# Patient Record
Sex: Female | Born: 1990 | Race: Black or African American | Hispanic: No | Marital: Single | Smoking: Never smoker
Health system: Southern US, Community
[De-identification: ages and names within clinical notes are randomized; demographics above are authoritative.]

## PROBLEM LIST (undated history)

## (undated) DIAGNOSIS — F32A Depression, unspecified: Secondary | ICD-10-CM

## (undated) DIAGNOSIS — A599 Trichomoniasis, unspecified: Principal | ICD-10-CM

## (undated) DIAGNOSIS — B9689 Other specified bacterial agents as the cause of diseases classified elsewhere: Secondary | ICD-10-CM

## (undated) DIAGNOSIS — D573 Sickle-cell trait: Secondary | ICD-10-CM

## (undated) DIAGNOSIS — N760 Acute vaginitis: Secondary | ICD-10-CM

## (undated) DIAGNOSIS — Z889 Allergy status to unspecified drugs, medicaments and biological substances status: Secondary | ICD-10-CM

## (undated) DIAGNOSIS — A4902 Methicillin resistant Staphylococcus aureus infection, unspecified site: Secondary | ICD-10-CM

## (undated) HISTORY — DX: Other specified bacterial agents as the cause of diseases classified elsewhere: N76.0

## (undated) HISTORY — DX: Depression, unspecified: F32.A

## (undated) HISTORY — DX: Allergy status to unspecified drugs, medicaments and biological substances: Z88.9

## (undated) HISTORY — DX: Methicillin resistant Staphylococcus aureus infection, unspecified site: A49.02

## (undated) HISTORY — DX: Trichomoniasis, unspecified: A59.9

## (undated) HISTORY — DX: Other specified bacterial agents as the cause of diseases classified elsewhere: B96.89

## (undated) HISTORY — PX: NO PAST SURGERIES: SHX2092

---
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
## 2009-10-04 ENCOUNTER — Emergency Department (HOSPITAL_COMMUNITY)
Admission: EM | Admit: 2009-10-04 | Discharge: 2009-10-04 | Payer: Self-pay | Source: Home / Self Care | Admitting: Emergency Medicine

## 2010-05-24 ENCOUNTER — Ambulatory Visit (HOSPITAL_COMMUNITY)
Admission: RE | Admit: 2010-05-24 | Discharge: 2010-05-24 | Payer: Self-pay | Source: Home / Self Care | Attending: Obstetrics & Gynecology | Admitting: Obstetrics & Gynecology

## 2010-05-27 ENCOUNTER — Emergency Department (HOSPITAL_COMMUNITY)
Admission: EM | Admit: 2010-05-27 | Discharge: 2010-05-27 | Payer: Self-pay | Source: Home / Self Care | Admitting: Emergency Medicine

## 2010-08-13 LAB — BASIC METABOLIC PANEL
CO2: 24 mEq/L (ref 19–32)
Calcium: 8.8 mg/dL (ref 8.4–10.5)
Chloride: 107 mEq/L (ref 96–112)
GFR calc Af Amer: >60 mL/min (ref >60)
Glucose, Bld: 94 mg/dL (ref 70–99)
Potassium: 3.5 mEq/L (ref 3.5–5.1)
Sodium: 137 mEq/L (ref 135–145)

## 2010-08-13 LAB — CBC
HCT: 31 % — ABNORMAL LOW (ref 36.0–46.0)
Hemoglobin: 10.7 g/dL — ABNORMAL LOW (ref 12.0–15.0)
MCH: 29.2 pg (ref 26.0–34.0)
MCHC: 34.5 g/dL (ref 30.0–36.0)
RBC: 3.66 MIL/uL — ABNORMAL LOW (ref 3.87–5.11)

## 2010-08-13 LAB — DIFFERENTIAL
Basophils Relative: 0 % (ref 0–1)
Eosinophils Absolute: 0.1 10*3/uL (ref 0.0–0.7)
Lymphs Abs: 1.4 10*3/uL (ref 0.7–4.0)
Monocytes Absolute: 0.6 10*3/uL (ref 0.1–1.0)
Monocytes Relative: 8 % (ref 3–12)
Neutrophils Relative %: 72 % (ref 43–77)

## 2010-08-13 LAB — HEPATIC FUNCTION PANEL
AST: 20 U/L (ref 0–37)
Albumin: 2.9 g/dL — ABNORMAL LOW (ref 3.5–5.2)
Alkaline Phosphatase: 42 U/L (ref 39–117)
Bilirubin, Direct: <0.1 mg/dL (ref 0.0–0.3)
Total Bilirubin: 0.5 mg/dL (ref 0.3–1.2)

## 2010-08-13 LAB — LIPASE, BLOOD: Lipase: 36 U/L (ref 11–59)

## 2010-08-17 ENCOUNTER — Other Ambulatory Visit: Payer: Self-pay | Admitting: Obstetrics & Gynecology

## 2010-08-17 ENCOUNTER — Inpatient Hospital Stay (HOSPITAL_COMMUNITY)
Admission: AD | Admit: 2010-08-17 | Discharge: 2010-08-19 | DRG: 775 | Disposition: A | Discharge status: Home / Self Care | Payer: 59 | Source: Ambulatory Visit | Attending: Family Medicine | Admitting: Family Medicine

## 2010-08-17 DIAGNOSIS — Z22330 Carrier of Group B streptococcus: Secondary | ICD-10-CM

## 2010-08-17 DIAGNOSIS — O9989 Other specified diseases and conditions complicating pregnancy, childbirth and the puerperium: Secondary | ICD-10-CM

## 2010-08-17 DIAGNOSIS — O99892 Other specified diseases and conditions complicating childbirth: Secondary | ICD-10-CM | POA: Diagnosis present

## 2010-08-17 LAB — CBC
HCT: 36.6 % (ref 36.0–46.0)
MCH: 26.5 pg (ref 26.0–34.0)
MCHC: 31.1 g/dL (ref 30.0–36.0)
MCV: 84.1 fL (ref 78.0–100.0)
MCV: 84.5 fL (ref 78.0–100.0)
Platelets: 207 10*3/uL (ref 150–400)
Platelets: 192 10*3/uL (ref 150–400)
RDW: 14.5 % (ref 11.5–15.5)
RDW: 14.5 % (ref 11.5–15.5)

## 2010-08-18 LAB — CBC
MCHC: 31.7 g/dL (ref 30.0–36.0)
Platelets: 185 10*3/uL (ref 150–400)
RDW: 14.7 % (ref 11.5–15.5)
WBC: 13.5 10*3/uL — ABNORMAL HIGH (ref 4.0–10.5)

## 2010-09-20 ENCOUNTER — Encounter (HOSPITAL_COMMUNITY): Payer: Self-pay | Admitting: Radiology

## 2010-09-20 ENCOUNTER — Emergency Department (HOSPITAL_COMMUNITY)
Admission: EM | Admit: 2010-09-20 | Discharge: 2010-09-20 | Disposition: A | Discharge status: Home / Self Care | Payer: 59 | Attending: Emergency Medicine | Admitting: Emergency Medicine

## 2010-09-20 ENCOUNTER — Emergency Department (HOSPITAL_COMMUNITY): Payer: 59

## 2010-09-20 DIAGNOSIS — D279 Benign neoplasm of unspecified ovary: Secondary | ICD-10-CM | POA: Insufficient documentation

## 2010-09-20 DIAGNOSIS — R1031 Right lower quadrant pain: Secondary | ICD-10-CM | POA: Insufficient documentation

## 2010-09-20 LAB — BASIC METABOLIC PANEL
Chloride: 103 mEq/L (ref 96–112)
Creatinine, Ser: 0.65 mg/dL (ref 0.4–1.2)
GFR calc Af Amer: >60 mL/min (ref >60)
Sodium: 137 mEq/L (ref 135–145)

## 2010-09-20 LAB — URINALYSIS, ROUTINE W REFLEX MICROSCOPIC
Glucose, UA: NEGATIVE mg/dL
Protein, ur: NEGATIVE mg/dL
Urobilinogen, UA: 0.2 mg/dL (ref 0.0–1.0)

## 2010-09-20 LAB — DIFFERENTIAL
Basophils Absolute: 0 10*3/uL (ref 0.0–0.1)
Basophils Relative: 1 % (ref 0–1)
Monocytes Absolute: 0.3 10*3/uL (ref 0.1–1.0)
Neutro Abs: 3.6 10*3/uL (ref 1.7–7.7)
Neutrophils Relative %: 64 % (ref 43–77)

## 2010-09-20 LAB — CBC
Hemoglobin: 13.6 g/dL (ref 12.0–15.0)
MCHC: 31.9 g/dL (ref 30.0–36.0)
RBC: 5.01 MIL/uL (ref 3.87–5.11)
WBC: 5.6 10*3/uL (ref 4.0–10.5)

## 2010-09-20 LAB — URINE MICROSCOPIC-ADD ON

## 2010-09-20 LAB — POCT PREGNANCY, URINE: Preg Test, Ur: NEGATIVE

## 2010-09-20 MED ORDER — IOHEXOL 300 MG/ML  SOLN
100.0000 mL | Freq: Once | INTRAMUSCULAR | Status: AC | PRN
  Administered 2010-09-20: 100 mL via INTRAVENOUS

## 2011-02-23 ENCOUNTER — Emergency Department (HOSPITAL_COMMUNITY)
Admission: EM | Admit: 2011-02-23 | Discharge: 2011-02-23 | Disposition: A | Discharge status: Home / Self Care | Payer: 59 | Attending: Emergency Medicine | Admitting: Emergency Medicine

## 2011-02-23 ENCOUNTER — Encounter (HOSPITAL_COMMUNITY): Payer: Self-pay

## 2011-02-23 DIAGNOSIS — J029 Acute pharyngitis, unspecified: Secondary | ICD-10-CM

## 2011-02-23 LAB — RAPID STREP SCREEN (MED CTR MEBANE ONLY): Streptococcus, Group A Screen (Direct): NEGATIVE

## 2011-02-23 NOTE — ED Provider Notes (Signed)
History     CSN: [id number] Arrival date & time: 02/23/2011 10:42 AM  Chief Complaint  Patient presents with  . Sore Throat    HPI  (Consider location/radiation/quality/duration/timing/severity/associated sxs/prior treatment)  Patient is a 20 y.o. female presenting with pharyngitis. The history is provided by the patient. No language interpreter was used.  Sore Throat This is a new problem. The current episode started yesterday. The problem occurs constantly. The problem has been unchanged. Associated symptoms include a sore throat. Pertinent negatives include no fatigue, fever, headaches, rash or swollen glands.    History reviewed. No pertinent past medical history.  History reviewed. No pertinent past surgical history.  History reviewed. No pertinent family history.  History  Substance Use Topics  . Smoking status: Never Smoker   . Smokeless tobacco: Not on file  . Alcohol Use: No    OB History    Grav Para Term Preterm Abortions TAB SAB Ect Mult Living                  Review of Systems  Review of Systems  Constitutional: Negative for fever and fatigue.  HENT: Positive for sore throat.   Skin: Negative for rash.  Neurological: Negative for headaches.  All other systems reviewed and are negative.    Allergies  Review of patient's allergies indicates no known allergies.  Home Medications  No current outpatient prescriptions on file.  Physical Exam    BP 97/64  Pulse 94  Temp(Src) 99.1 F (37.3 C) (Oral)  Resp 20  Ht 5\' 2"  (1.575 m)  Wt 120 lb (54.432 kg)  BMI 21.95 kg/m2  SpO2 100%  LMP 02/16/2011  Physical Exam  Nursing note and vitals reviewed. Constitutional: She is oriented to person, place, and time. She appears well-developed and well-nourished. No distress.  HENT:  Head: Normocephalic and atraumatic.  Mouth/Throat: No oropharyngeal exudate.       pharyngeal erythema.  No tonsillar swelling.  Eyes: EOM are normal.  Neck: Normal range  of motion.  Cardiovascular: Normal rate, regular rhythm and normal heart sounds.   Pulmonary/Chest: Effort normal and breath sounds normal.  Abdominal: Soft. She exhibits no distension. There is no tenderness.  Musculoskeletal: Normal range of motion.  Lymphadenopathy:    She has no cervical adenopathy.  Neurological: She is alert and oriented to person, place, and time. No cranial nerve deficit or sensory deficit.  Skin: Skin is warm and dry. She is not diaphoretic.  Psychiatric: She has a normal mood and affect. Judgment normal.    ED Course  Procedures (including critical care time)   Labs Reviewed  RAPID STREP SCREEN   No results found.   No diagnosis found.   MDM         Worthy Rancher, PA 02/23/11 1158

## 2011-02-23 NOTE — ED Notes (Signed)
Pt presents with sore throat since yesterday. Pt denies taking medication for symptoms. Pt denies all other symptoms.

## 2011-02-23 NOTE — ED Provider Notes (Signed)
Medical screening examination/treatment/procedure(s) were performed by non-physician practitioner and as supervising physician I was immediately available for consultation/collaboration.   Oluwadara Gorman M [place]endale Wherry, DO 02/23/11 1947 

## 2011-02-24 ENCOUNTER — Emergency Department (HOSPITAL_COMMUNITY)
Admission: EM | Admit: 2011-02-24 | Discharge: 2011-02-24 | Disposition: A | Discharge status: Home / Self Care | Payer: 59 | Attending: Emergency Medicine | Admitting: Emergency Medicine

## 2011-02-24 ENCOUNTER — Encounter (HOSPITAL_COMMUNITY): Payer: Self-pay | Admitting: *Deleted

## 2011-02-24 DIAGNOSIS — J029 Acute pharyngitis, unspecified: Secondary | ICD-10-CM | POA: Insufficient documentation

## 2011-02-24 MED ORDER — NAPROXEN 500 MG PO TABS: 500.0000 mg | ORAL_TABLET | Freq: Two times a day (BID) | ORAL | Status: DC

## 2011-02-24 NOTE — ED Notes (Addendum)
Pt states was here yesterday with same complaint, returned here today with worsening symptoms.  Pt reports sore throat started Friday and continually gotten worse. Has taken tylenol last at 1800 and taken 3 doses of thera-flu yesterday. Denies fever/chills and N/V.  Redness noted in throat, however no "white patches" noted as pt reports to seeing earlier today.

## 2011-02-24 NOTE — ED Notes (Signed)
Pt c/o sore throat; pt states she has white patches on her throat

## 2011-02-24 NOTE — ED Notes (Signed)
MD at bedside. 

## 2011-02-24 NOTE — ED Provider Notes (Signed)
History     CSN: [id number] Arrival date & time: 02/24/2011 10:45 AM  Chief Complaint  Patient presents with  . Sore Throat    HPI  (Consider location/radiation/quality/duration/timing/severity/associated sxs/prior treatment)  The history is provided by the patient.  ONSET OF SORETHROAT ON Friday SEEN YESTERDAY IN ED WITH NEGATIVE STREP. NO HX OF STREP IN THE PAST. PATIENT STATES THROAT IS NOT BETTER AND NOW HAS WHITE STUFF ON TONSILS. NO CONGESTION NO COUGH NO FEVER.   History reviewed. No pertinent past medical history.  History reviewed. No pertinent past surgical history.  History reviewed. No pertinent family history.  History  Substance Use Topics  . Smoking status: Never Smoker   . Smokeless tobacco: Not on file  . Alcohol Use: No    OB History    Grav Para Term Preterm Abortions TAB SAB Ect Mult Living                  Review of Systems  Review of Systems  Constitutional: Negative for fever.  HENT: Negative for ear pain, congestion, neck pain and neck stiffness.   Eyes: Negative for redness.  Respiratory: Negative for cough and shortness of breath.   Cardiovascular: Negative for chest pain.  Gastrointestinal: Negative for nausea, vomiting, abdominal pain and diarrhea.  Genitourinary: Negative for dysuria.  Musculoskeletal: Negative for back pain.  Skin: Negative for rash.  Neurological: Negative for headaches.    Allergies  Review of patient's allergies indicates no known allergies.  Home Medications   Current Outpatient Rx  Name Route Sig Dispense Refill  . ETONOGESTREL 68 MG [place] IMPL Subcutaneous Inject 1 each into the skin continuous.      . THROAT SPRAY MT Mouth/Throat Use as directed 1-2 sprays in the mouth or throat as needed. For sore throat     . TYLENOL COLD PO Oral Take 30 mLs by mouth as needed. For sore throat       Physical Exam    BP 110/66  Pulse 84  Temp(Src) 99 F (37.2 C) (Oral)  Resp 18  Ht 5\' 2"  (1.575 m)  Wt 120 lb  (54.432 kg)  BMI 21.95 kg/m2  SpO2 100%  LMP 02/16/2011  Physical Exam  Nursing note and vitals reviewed. Constitutional: She is oriented to person, place, and time. She appears well-developed and well-nourished.  HENT:  Head: Normocephalic.  Mouth/Throat: Oropharynx is clear and moist.  Eyes: Conjunctivae and EOM are normal. Pupils are equal, round, and reactive to light.  Neck: Normal range of motion. Neck supple.  Cardiovascular: Normal rate, regular rhythm and normal heart sounds.   No murmur heard. Pulmonary/Chest: Effort normal and breath sounds normal. She has no wheezes. She has no rales. She exhibits no tenderness.  Abdominal: Soft. Bowel sounds are normal. There is no tenderness.  Musculoskeletal: Normal range of motion. She exhibits no edema and no tenderness.  Neurological: She is alert and oriented to person, place, and time. No cranial nerve deficit.  Skin: Skin is warm and dry. No rash noted.    ED Course  Procedures (including critical care time)  Results for orders placed during the hospital encounter of 02/23/11  RAPID STREP SCREEN      Component Value Range   Streptococcus, Group A Screen (Direct) NEGATIVE  NEGATIVE         MDM PHARANGITIS.  STEP NEGATIVE COULD BE EARLY MONONUCLEOSIS  WILL RX WITH NAPROSYN CAN GET MONO TESTING IF NOT IMPROVING IN ONE WEEK.  Shelda Jakes, MD 02/24/11 [phone]

## 2011-02-24 NOTE — ED Notes (Signed)
MD at bedside. Dr Deretha Emory  At bedside, examined pt and spoke with pt in regards to plan of care. Pt verbalized understanding.

## 2011-11-22 IMAGING — CT CT ABD-PELV W/ CM
2 of 3 series · 16 of 46 positions shown, 18 images · IV contrast (Omnipaque 300)
Comparison: None

CLINICAL DATA: Lower quadrant pain, vaginal delivery 1 month ago

CT ABDOMEN AND PELVIS WITH CONTRAST
TECHNIQUE: Multidetector CT imaging of the abdomen and pelvis was
performed following the standard protocol during bolus
administration of intravenous contrast. Sagittal and coronal MPR
images reconstructed from axial data set.
Contrast: Dilute oral contrast and 100 ml Vmnipaque-K88 IV

[Series 2: abd_pel_with 5.0 b40f · axial · 0.59mm/px · z∈[-440,-70]mm · 13 of 86 slices shown, 15 images]
[im 6/86  soft-tissue]
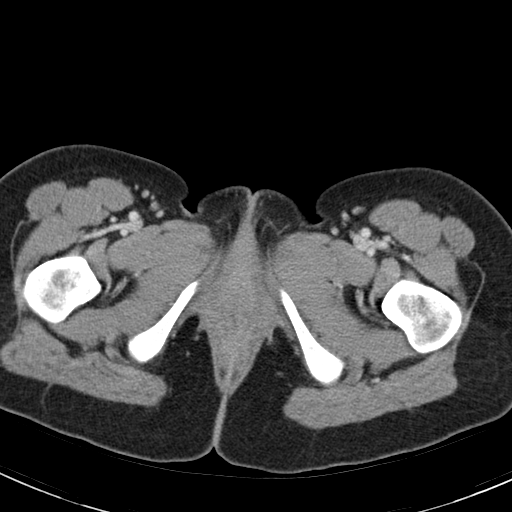
[im 6/86  bone]
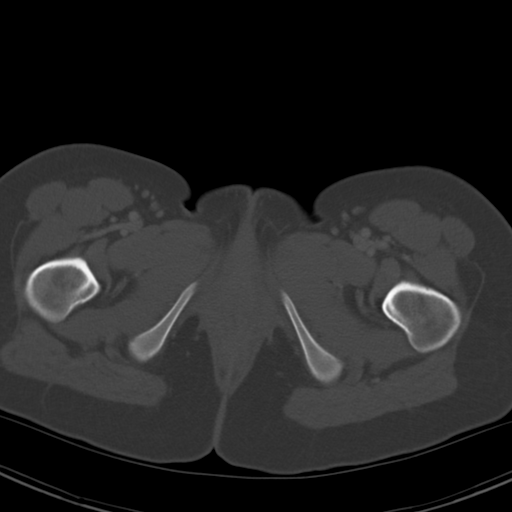
[im 11/86  soft-tissue]
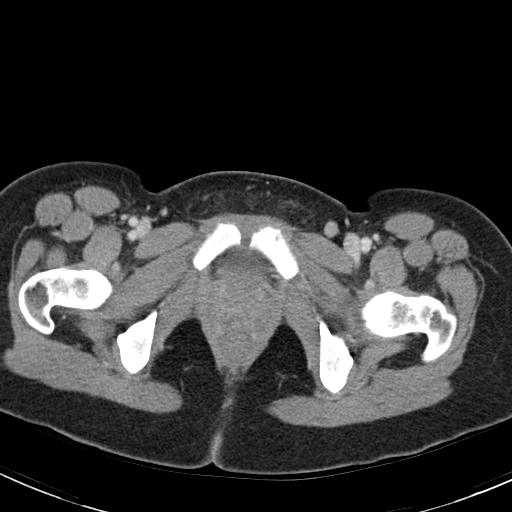
[im 17/86  soft-tissue]
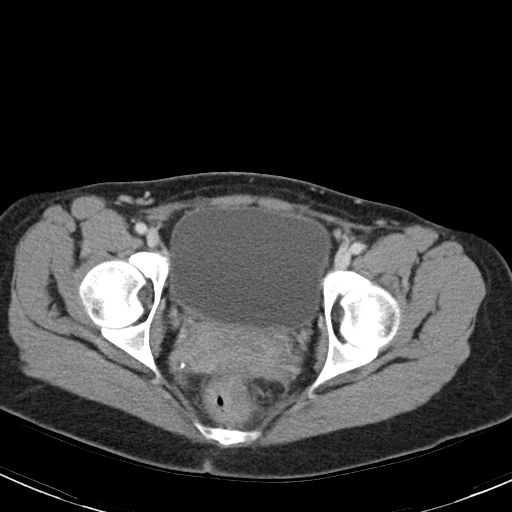
[im 25/86  soft-tissue]
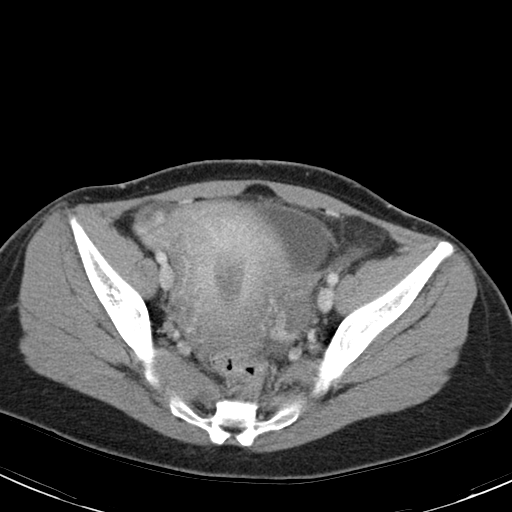
[im 31/86  soft-tissue]
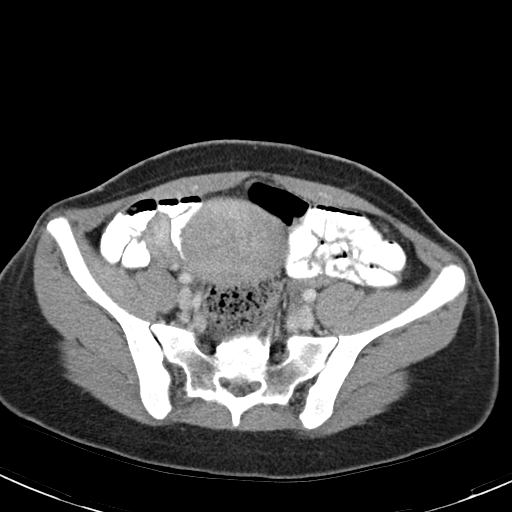
[im 36/86  soft-tissue]
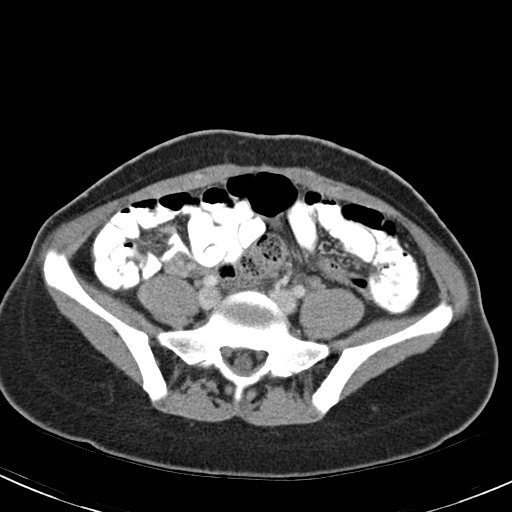
[im 44/86  soft-tissue]
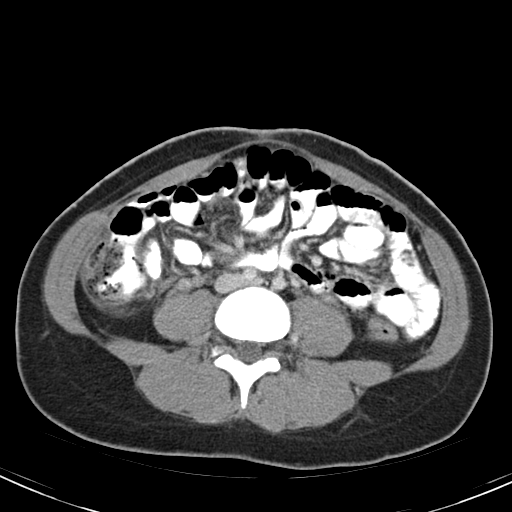
[im 50/86  soft-tissue]
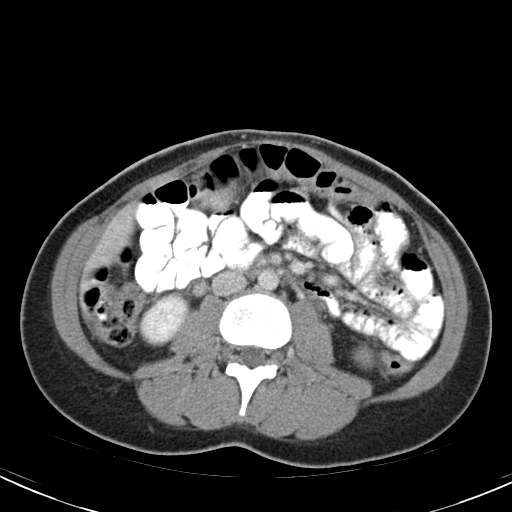
[im 55/86  soft-tissue]
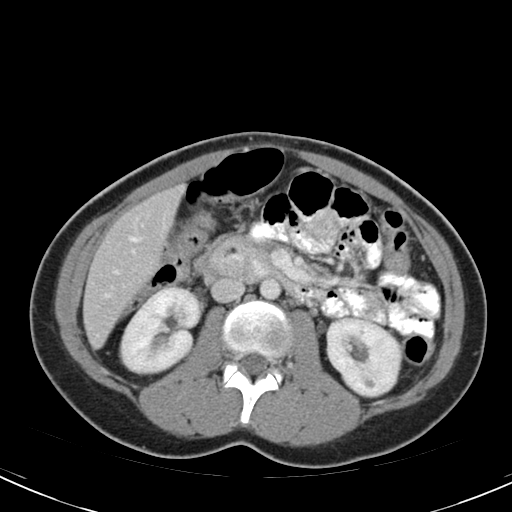
[im 55/86  bone]
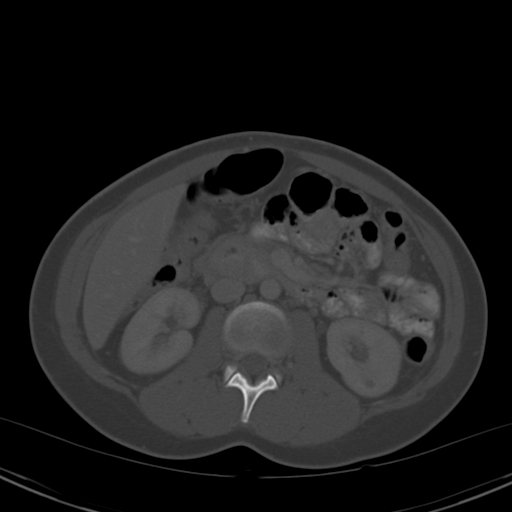
[im 61/86  soft-tissue]
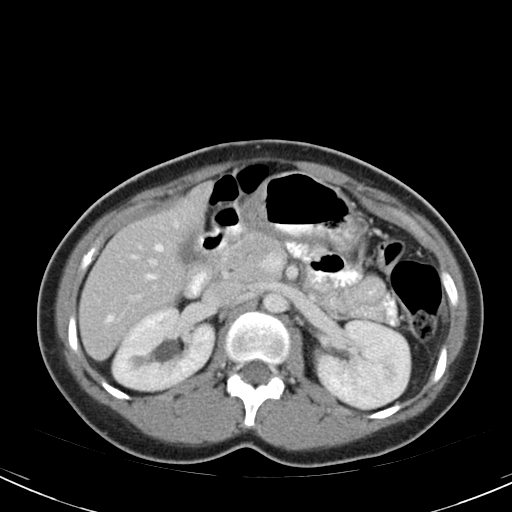
[im 69/86  soft-tissue]
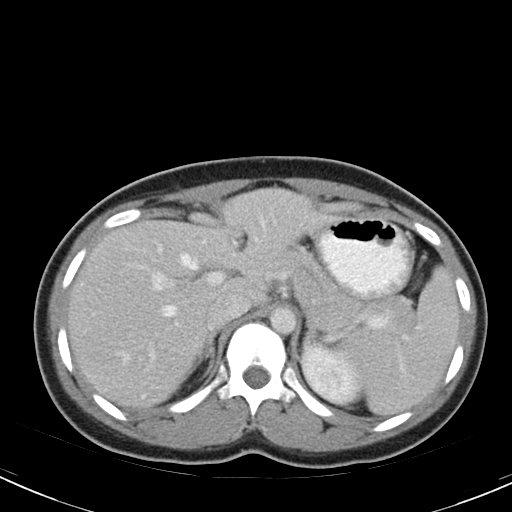
[im 75/86  soft-tissue]
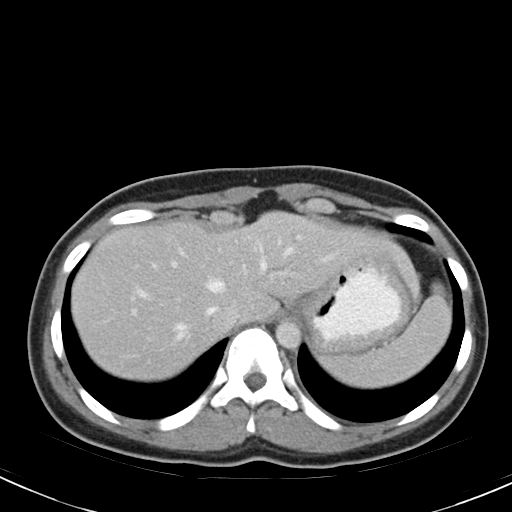
[im 80/86  soft-tissue]
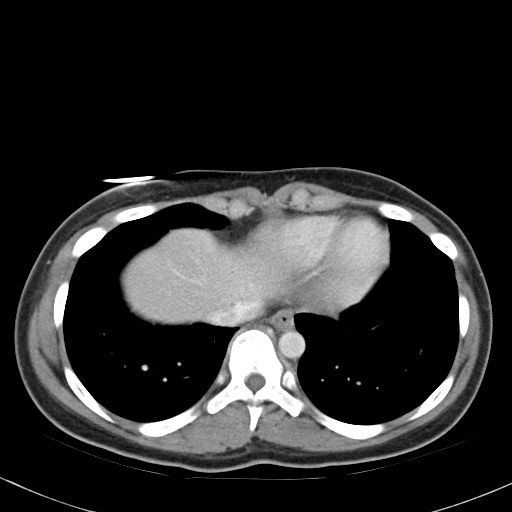

[Series 4: abd_pel_with 3.0 spo cor · coronal · 0.65mm/px · 3 of 64 slices shown]
[im 22/64  soft-tissue]
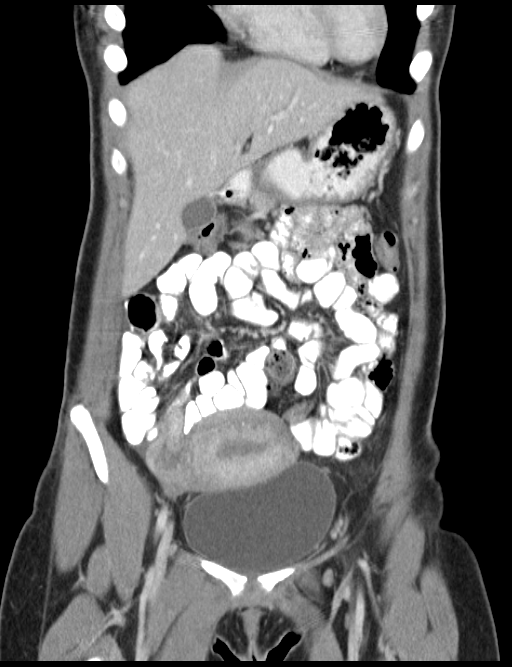
[im 29/64  soft-tissue]
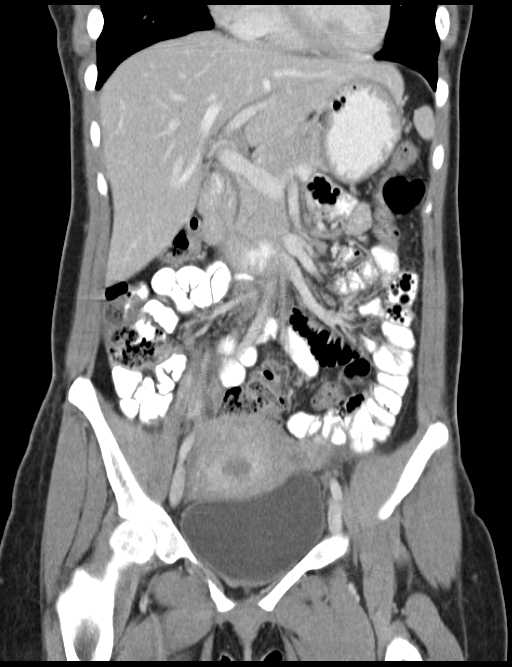
[im 36/64  soft-tissue]
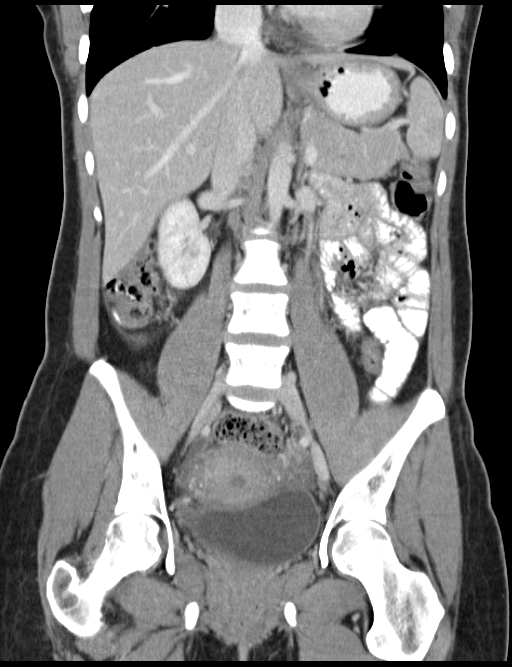

[16 of 46 positions shown; findings below may reference images not displayed]

FINDINGS: Lung bases clear.
Small left renal cysts.
Liver, spleen, pancreas, kidneys, and adrenal glands otherwise
unremarkable. Appendix appears coiled adjacent to cecum.
Slight prominence of the uterus compatible with postpartum state.
Unremarkable left adnexa.
Right ovary / adnexa appears slightly more prominent size without
gross mass.
Minimal free pelvic fluid adjacent to right ovary.
Umbilical hernia containing fat.
No mass, adenopathy, abnormal fluid collection or acute bony
findings.
IMPRESSION: Small left renal cyst.
Small amount free pelvic fluid adjacent to the right ovary, cannot
exclude ruptured ovarian cyst.
Tiny umbilical hernia.
Small left renal cyst.
Remainder of exam unremarkable.

## 2012-01-17 ENCOUNTER — Emergency Department (HOSPITAL_COMMUNITY)
Admission: EM | Admit: 2012-01-17 | Discharge: 2012-01-17 | Disposition: A | Discharge status: Home / Self Care | Payer: 59 | Attending: Emergency Medicine | Admitting: Emergency Medicine

## 2012-01-17 ENCOUNTER — Encounter (HOSPITAL_COMMUNITY): Payer: Self-pay | Admitting: Emergency Medicine

## 2012-01-17 DIAGNOSIS — B9689 Other specified bacterial agents as the cause of diseases classified elsewhere: Secondary | ICD-10-CM | POA: Insufficient documentation

## 2012-01-17 DIAGNOSIS — N760 Acute vaginitis: Secondary | ICD-10-CM

## 2012-01-17 DIAGNOSIS — A499 Bacterial infection, unspecified: Secondary | ICD-10-CM | POA: Insufficient documentation

## 2012-01-17 LAB — URINE MICROSCOPIC-ADD ON

## 2012-01-17 LAB — URINALYSIS, ROUTINE W REFLEX MICROSCOPIC
Glucose, UA: NEGATIVE mg/dL
Leukocytes, UA: NEGATIVE
Specific Gravity, Urine: >1.03 — ABNORMAL HIGH (ref 1.005–1.030)
pH: 6 (ref 5.0–8.0)

## 2012-01-17 LAB — CBC
HCT: 43.6 % (ref 36.0–46.0)
MCHC: 33.9 g/dL (ref 30.0–36.0)
MCV: 86.2 fL (ref 78.0–100.0)
RDW: 12.3 % (ref 11.5–15.5)

## 2012-01-17 LAB — PREGNANCY, URINE: Preg Test, Ur: NEGATIVE

## 2012-01-17 LAB — COMPREHENSIVE METABOLIC PANEL
Albumin: 4.3 g/dL (ref 3.5–5.2)
BUN: 11 mg/dL (ref 6–23)
Calcium: 9.3 mg/dL (ref 8.4–10.5)
Creatinine, Ser: 0.66 mg/dL (ref 0.50–1.10)
Potassium: 3.6 mEq/L (ref 3.5–5.1)
Total Protein: 7.4 g/dL (ref 6.0–8.3)

## 2012-01-17 LAB — WET PREP, GENITAL: Yeast Wet Prep HPF POC: NONE SEEN

## 2012-01-17 MED ORDER — METRONIDAZOLE 500 MG PO TABS: 500.0000 mg | ORAL_TABLET | Freq: Two times a day (BID) | ORAL | Status: AC

## 2012-01-17 MED ORDER — METRONIDAZOLE 500 MG PO TABS
500.0000 mg | ORAL_TABLET | Freq: Once | ORAL | Status: AC
  Administered 2012-01-17: 500 mg via ORAL
  Filled 2012-01-17: qty 1

## 2012-01-17 NOTE — ED Notes (Signed)
Discharge instructions given and reviewed with patient.  Prescription given for Flagyl; effects and use explained.  Patient verbalized understanding to complete all antibiotic and to follow up with her regular doctor.  Patient ambulatory; discharged home in good condition.

## 2012-01-17 NOTE — ED Provider Notes (Signed)
History  This chart was scribed for Carleene Cooper III, MD by Erskine Emery. This patient was seen in room [facility] and the patient's care was started at 16:39.   CSN: [id number]  Arrival date & time 01/17/12  1604   First MD Initiated Contact with Patient 01/17/12 1639      Chief Complaint  Patient presents with  . Abdominal Pain    (Consider location/radiation/quality/duration/timing/severity/associated sxs/prior treatment) HPI Victoria Mcmahon is a 21 y.o. female who presents to the Emergency Department complaining of intermittent sharp abdominal pains since having her child (without cesarean section) in March of last year. Pt reports the pain was so bad yesterday she couldn't move and it felt like she was having contractions, but the pain is sometimes relieved by laying down. Pt also reports occasional chest pain and some nausea and loose stools today but denies any emesis, diarrhea (she reports she only has a bowel movement every other day), vaginal dicharge, dysuria, fever, ear ache, sore throat, cough, rash, or syncope. Pt is on the Depo-Provera shot now so she has no periods; her last shot was in June, she is due for another one in September. Pt is otherwise healthy, has no medical conditions, is on no medications, has had no surgeries, and has NKDA. Pt does not smoke or drink.     History reviewed. No pertinent past medical history.  History reviewed. No pertinent past surgical history.  History reviewed. No pertinent family history.  History  Substance Use Topics  . Smoking status: Never Smoker   . Smokeless tobacco: Not on file  . Alcohol Use: No    OB History    Grav Para Term Preterm Abortions TAB SAB Ect Mult Living                  Review of Systems  Constitutional: Negative for fever.  HENT: Negative for ear pain and sore throat.   Eyes: Negative for pain.  Respiratory: Negative for cough and shortness of breath.   Cardiovascular: Positive for chest pain  (occasionally).  Gastrointestinal: Positive for nausea and abdominal pain. Negative for vomiting and diarrhea.  Genitourinary: Negative for dysuria and vaginal discharge.  Musculoskeletal: Negative for joint swelling.  Skin: Negative for rash.  Neurological: Negative for syncope and weakness.  Psychiatric/Behavioral: Negative for hallucinations.  All other systems reviewed and are negative.    Allergies  Review of patient's allergies indicates no known allergies.  Home Medications   Current Outpatient Rx  Name Route Sig Dispense Refill  . ETONOGESTREL 68 MG [place] IMPL Subcutaneous Inject 1 each into the skin continuous.      Marland Kitchen NAPROXEN 500 MG PO TABS Oral Take 1 tablet (500 mg total) by mouth 2 (two) times daily. 14 tablet 0  . THROAT SPRAY MT Mouth/Throat Use as directed 1-2 sprays in the mouth or throat as needed. For sore throat     . TYLENOL COLD PO Oral Take 30 mLs by mouth as needed. For sore throat       Triage Vitals: BP 102/68  Pulse 88  Temp 99.1 F (37.3 C) (Oral)  Resp 20  Ht 5\' 2"  (1.575 m)  Wt 120 lb (54.432 kg)  BMI 21.95 kg/m2  SpO2 100%  Physical Exam  Nursing note and vitals reviewed. Constitutional: She is oriented to person, place, and time. She appears well-developed and well-nourished. No distress.  HENT:  Head: Normocephalic and atraumatic.  Right Ear: External ear normal.  Left Ear: External ear normal.  Mouth/Throat: Oropharynx is clear and moist. No oropharyngeal exudate.  Eyes: EOM are normal. Pupils are equal, round, and reactive to light.  Neck: Neck supple. No tracheal deviation present.  Cardiovascular: Normal rate, regular rhythm and normal heart sounds.   No murmur heard. Pulmonary/Chest: Effort normal and breath sounds normal. No respiratory distress. She has no wheezes.  Abdominal: Soft. Bowel sounds are normal. She exhibits no distension. There is tenderness.       Suprapubic tenderness.  Genitourinary: Uterus normal. Uterus is not  enlarged and not tender. Right adnexum displays no tenderness. Left adnexum displays no tenderness. Vaginal discharge found.       Upon pelvic exam: malodorous sero-purulent vaginal discharge found. Bimanually: no uterine enlargement or tenderness and no adnexa tenderness.   Musculoskeletal: Normal range of motion. She exhibits no edema.  Lymphadenopathy:    She has no cervical adenopathy.  Neurological: She is alert and oriented to person, place, and time.  Skin: Skin is warm and dry.  Psychiatric: She has a normal mood and affect.    ED Course  Procedures (including critical care time) DIAGNOSTIC STUDIES: Oxygen Saturation is 100% on room air, normal by my interpretation.    COORDINATION OF CARE: 16:45--I evaluated the patient and we discussed a treatment plan including blood work, urinalysis, and pelvic exam to which the pt agreed.   17:20--I performed a pelvic examination.     Labs Reviewed  CBC  COMPREHENSIVE METABOLIC PANEL  URINALYSIS, ROUTINE W REFLEX MICROSCOPIC  PREGNANCY, URINE   8:06 PM Results for orders placed during the hospital encounter of 01/17/12  CBC      Component Value Range   WBC 6.6  4.0 - 10.5 K/uL   RBC 5.06  3.87 - 5.11 MIL/uL   Hemoglobin 14.8  12.0 - 15.0 g/dL   HCT 16.1  09.6 - 04.5 %   MCV 86.2  78.0 - 100.0 fL   MCH 29.2  26.0 - 34.0 pg   MCHC 33.9  30.0 - 36.0 g/dL   RDW 40.9  81.1 - 91.4 %   Platelets 239  150 - 400 K/uL  COMPREHENSIVE METABOLIC PANEL      Component Value Range   Sodium 139  135 - 145 mEq/L   Potassium 3.6  3.5 - 5.1 mEq/L   Chloride 105  96 - 112 mEq/L   CO2 23  19 - 32 mEq/L   Glucose, Bld 82  70 - 99 mg/dL   BUN 11  6 - 23 mg/dL   Creatinine, Ser 7.82  0.50 - 1.10 mg/dL   Calcium 9.3  8.4 - 95.6 mg/dL   Total Protein 7.4  6.0 - 8.3 g/dL   Albumin 4.3  3.5 - 5.2 g/dL   AST 17  0 - 37 U/L   ALT 13  0 - 35 U/L   Alkaline Phosphatase 50  39 - 117 U/L   Total Bilirubin 1.1  0.3 - 1.2 mg/dL   GFR calc non Af Amer  >90  >90 mL/min   GFR calc Af Amer >90  >90 mL/min  URINALYSIS, ROUTINE W REFLEX MICROSCOPIC      Component Value Range   Color, Urine AMBER (*) YELLOW   APPearance CLOUDY (*) CLEAR   Specific Gravity, Urine >1.030 (*) 1.005 - 1.030   pH 6.0  5.0 - 8.0   Glucose, UA NEGATIVE  NEGATIVE mg/dL   Hgb urine dipstick LARGE (*) NEGATIVE   Bilirubin Urine NEGATIVE  NEGATIVE   Ketones,  ur 40 (*) NEGATIVE mg/dL   Protein, ur TRACE (*) NEGATIVE mg/dL   Urobilinogen, UA 0.2  0.0 - 1.0 mg/dL   Nitrite NEGATIVE  NEGATIVE   Leukocytes, UA NEGATIVE  NEGATIVE  PREGNANCY, URINE      Component Value Range   Preg Test, Ur NEGATIVE  NEGATIVE  WET PREP, GENITAL      Component Value Range   Yeast Wet Prep HPF POC NONE SEEN  NONE SEEN   Trich, Wet Prep NONE SEEN  NONE SEEN   Clue Cells Wet Prep HPF POC FEW (*) NONE SEEN   WBC, Wet Prep HPF POC FEW (*) NONE SEEN  URINE MICROSCOPIC-ADD ON      Component Value Range   Squamous Epithelial / LPF MANY (*) RARE   WBC, UA 0-2  <3 WBC/hpf   RBC / HPF 0-2  <3 RBC/hpf   Bacteria, UA MANY (*) RARE   Lab tests suggest bacterial vaginosis.  Rx metronidazole 500 mg twice a day for seven days.   1. Bacterial vaginosis      I personally performed the services described in this documentation, which was scribed in my presence. The recorded information has been reviewed and considered.  Osvaldo Human, MD        Carleene Cooper III, MD 01/17/12 2008

## 2012-01-17 NOTE — ED Notes (Signed)
Pt c/o generalized abd pain since having her child.

## 2012-01-18 LAB — GC/CHLAMYDIA PROBE AMP, GENITAL: Chlamydia, DNA Probe: NEGATIVE

## 2012-02-24 ENCOUNTER — Other Ambulatory Visit: Payer: Self-pay | Admitting: Adult Health

## 2012-02-24 ENCOUNTER — Other Ambulatory Visit (HOSPITAL_COMMUNITY)
Admission: RE | Admit: 2012-02-24 | Discharge: 2012-02-24 | Disposition: A | Discharge status: Home / Self Care | Payer: 59 | Source: Ambulatory Visit | Attending: Obstetrics and Gynecology | Admitting: Obstetrics and Gynecology

## 2012-02-24 DIAGNOSIS — Z3049 Encounter for surveillance of other contraceptives: Secondary | ICD-10-CM | POA: Insufficient documentation

## 2012-02-24 DIAGNOSIS — Z113 Encounter for screening for infections with a predominantly sexual mode of transmission: Secondary | ICD-10-CM | POA: Insufficient documentation

## 2012-07-21 ENCOUNTER — Emergency Department (HOSPITAL_COMMUNITY)
Admission: EM | Admit: 2012-07-21 | Discharge: 2012-07-21 | Disposition: A | Discharge status: Home / Self Care | Payer: 59 | Attending: Emergency Medicine | Admitting: Emergency Medicine

## 2012-07-21 ENCOUNTER — Encounter (HOSPITAL_COMMUNITY): Payer: Self-pay

## 2012-07-21 DIAGNOSIS — H81399 Other peripheral vertigo, unspecified ear: Secondary | ICD-10-CM | POA: Diagnosis not present

## 2012-07-21 DIAGNOSIS — R350 Frequency of micturition: Secondary | ICD-10-CM | POA: Diagnosis not present

## 2012-07-21 DIAGNOSIS — Z3202 Encounter for pregnancy test, result negative: Secondary | ICD-10-CM | POA: Insufficient documentation

## 2012-07-21 DIAGNOSIS — J329 Chronic sinusitis, unspecified: Secondary | ICD-10-CM | POA: Insufficient documentation

## 2012-07-21 DIAGNOSIS — R42 Dizziness and giddiness: Secondary | ICD-10-CM | POA: Diagnosis present

## 2012-07-21 DIAGNOSIS — R51 Headache: Secondary | ICD-10-CM | POA: Diagnosis not present

## 2012-07-21 DIAGNOSIS — Z862 Personal history of diseases of the blood and blood-forming organs and certain disorders involving the immune mechanism: Secondary | ICD-10-CM | POA: Insufficient documentation

## 2012-07-21 DIAGNOSIS — J3489 Other specified disorders of nose and nasal sinuses: Secondary | ICD-10-CM | POA: Diagnosis not present

## 2012-07-21 HISTORY — DX: Sickle-cell trait: D57.3

## 2012-07-21 LAB — URINALYSIS, ROUTINE W REFLEX MICROSCOPIC
Bilirubin Urine: NEGATIVE
Glucose, UA: NEGATIVE mg/dL
Hgb urine dipstick: NEGATIVE
Ketones, ur: NEGATIVE mg/dL
Leukocytes, UA: NEGATIVE
Nitrite: NEGATIVE
Protein, ur: NEGATIVE mg/dL
Specific Gravity, Urine: 1.02 (ref 1.005–1.030)
Urobilinogen, UA: 0.2 mg/dL (ref 0.0–1.0)
pH: 7.5 (ref 5.0–8.0)

## 2012-07-21 LAB — POCT I-STAT, CHEM 8
BUN: 10 mg/dL (ref 6–23)
Calcium, Ion: 1.21 mmol/L (ref 1.12–1.23)
Chloride: 109 meq/L (ref 96–112)
Creatinine, Ser: 0.6 mg/dL (ref 0.50–1.10)
Glucose, Bld: 101 mg/dL — ABNORMAL HIGH (ref 70–99)
HCT: 43 % (ref 36.0–46.0)
Hemoglobin: 14.6 g/dL (ref 12.0–15.0)
Potassium: 3.6 meq/L (ref 3.5–5.1)
Sodium: 142 meq/L (ref 135–145)
TCO2: 22 mmol/L (ref 0–100)

## 2012-07-21 LAB — PREGNANCY, URINE: Preg Test, Ur: NEGATIVE

## 2012-07-21 MED ORDER — MECLIZINE HCL 25 MG PO TABS: 25.0000 mg | ORAL_TABLET | Freq: Three times a day (TID) | ORAL | Status: DC | PRN

## 2012-07-21 MED ORDER — MECLIZINE HCL 12.5 MG PO TABS
25.0000 mg | ORAL_TABLET | Freq: Once | ORAL | Status: AC
  Administered 2012-07-21: 25 mg via ORAL
  Filled 2012-07-21: qty 2

## 2012-07-21 MED ORDER — IBUPROFEN 400 MG PO TABS
400.0000 mg | ORAL_TABLET | Freq: Once | ORAL | Status: AC
  Administered 2012-07-21: 400 mg via ORAL
  Filled 2012-07-21: qty 1

## 2012-07-21 MED ORDER — ACETAMINOPHEN 500 MG PO TABS
1000.0000 mg | ORAL_TABLET | Freq: Once | ORAL | Status: AC
  Administered 2012-07-21: 1000 mg via ORAL
  Filled 2012-07-21: qty 2

## 2012-07-21 NOTE — ED Notes (Signed)
Pt alert & oriented x4, stable gait. Patient given discharge instructions, paperwork & prescription(s). Patient  instructed to stop at the registration desk to finish any additional paperwork. Patient verbalized understanding. Pt left department w/ no further questions. 

## 2012-07-21 NOTE — ED Notes (Signed)
Pt reports lightheadedness for 2 days, worse when standing.  Denies any n/v/d or fever. Normal po intake. Having some urinary incont, ofr 3 days.

## 2012-07-21 NOTE — ED Notes (Signed)
States increase dizziness for the past 3 days. Worse today. Pt has not contacted PCP.

## 2012-07-21 NOTE — ED Provider Notes (Signed)
History     CSN: [id number]  Arrival date & time 07/21/12  1804   First MD Initiated Contact with Patient 07/21/12 2002      Chief Complaint  Patient presents with  . Dizziness     HPI Pt was seen at 2005.   Per pt, c/o gradual onset and persistence of constant sinus and ears congestion for the past 3 days.  Has been associated with frontal headache and facial "pain."  Pt also c/o feeling "swimmy headed" where "everything is moving" intermittently for the past 2 days. Pt also c/o "urinating at lot" and is not sure if she has a UTI. Denies N/V/D, no abd pain, no back pain, no CP/palpitations, no SOB/cough, no vaginal bleeding/discharge, no dysuria/hematuria, no fevers, no sore throat, no rash, no injury, no visual changes, no focal motor weakness, no tingling/numbness in extremities.       Past Medical History  Diagnosis Date  . Sickle cell trait     History reviewed. No pertinent past surgical history.   History  Substance Use Topics  . Smoking status: Never Smoker   . Smokeless tobacco: Not on file  . Alcohol Use: No    Review of Systems ROS: Statement: All systems negative except as marked or noted in the HPI; Constitutional: Negative for fever and chills. ; ; Eyes: Negative for eye pain, redness and discharge. ; ; ENMT: Negative for ear pain, hoarseness, sore throat. +nasal congestion, sinus pressure, facial pain. ; ; Cardiovascular: Negative for chest pain, palpitations, diaphoresis, dyspnea and peripheral edema. ; ; Respiratory: Negative for cough, wheezing and stridor. ; ; Gastrointestinal: Negative for nausea, vomiting, diarrhea, abdominal pain, blood in stool, hematemesis, jaundice and rectal bleeding. . ; ; Genitourinary: +frequent urination. Negative for dysuria, flank pain and hematuria. ; ; GYN:  No vaginal bleeding, no vaginal discharge, no vulvar pain.;; Musculoskeletal: Negative for back pain and neck pain. Negative for swelling and trauma.; ; Skin: Negative for  pruritus, rash, abrasions, blisters, bruising and skin lesion.; ; Neuro: +"dizziness." Negative for headache, lightheadedness and neck stiffness. Negative for weakness, altered level of consciousness , altered mental status, extremity weakness, paresthesias, involuntary movement, seizure and syncope.     Allergies  Review of patient's allergies indicates no known allergies.  Home Medications   Current Outpatient Rx  Name  Route  Sig  Dispense  Refill  . medroxyPROGESTERone (DEPO-PROVERA) 150 MG/ML injection   Intramuscular   Inject 150 mg into the muscle every 3 (three) months.           BP 115/71  Pulse 98  Temp(Src) 98.4 F (36.9 C) (Oral)  Resp 18  Ht 5\' 2"  (1.575 m)  SpO2 100%  Physical Exam 2010: Physical examination:  Nursing notes reviewed; Vital signs and O2 SAT reviewed;  Constitutional: Well developed, Well nourished, Well hydrated, In no acute distress; Head:  Normocephalic, atraumatic; Eyes: EOMI, PERRL, No scleral icterus; ENMT: TM's clear bilat. +edemetous nasal turbinates bilat with clear rhinorrhea.  Mouth and pharynx normal, Mucous membranes moist; Neck: Supple, Full range of motion, No lymphadenopathy; Cardiovascular: Regular rate and rhythm, No murmur, rub, or gallop; Respiratory: Breath sounds clear & equal bilaterally, No rales, rhonchi, wheezes.  Speaking full sentences with ease, Normal respiratory effort/excursion; Chest: Nontender, Movement normal; Abdomen: Soft, Nontender, Nondistended, Normal bowel sounds; Genitourinary: No CVA tenderness; Extremities: Pulses normal, No tenderness, No edema, No calf edema or asymmetry.; Neuro: AA&Ox3, Major CN grossly intact.  Strength 5/5 equal bilat UE's and LE's.  DTR  2/4 equal bilat UE's and LE's.  No gross sensory deficits. Climbs on and off stretcher easily by herself. Gait steady. Speech clear.  No facial droop.  +right horizontal gaze fatigable nystagmus.;; Skin: Color normal, Warm, Dry.   ED Course  Procedures      MDM  MDM Reviewed: previous chart, nursing note and vitals Interpretation: labs     Results for orders placed during the hospital encounter of 07/21/12  URINALYSIS, ROUTINE W REFLEX MICROSCOPIC      Result Value Range   Color, Urine YELLOW  YELLOW   APPearance CLEAR  CLEAR   Specific Gravity, Urine 1.020  1.005 - 1.030   pH 7.5  5.0 - 8.0   Glucose, UA NEGATIVE  NEGATIVE mg/dL   Hgb urine dipstick NEGATIVE  NEGATIVE   Bilirubin Urine NEGATIVE  NEGATIVE   Ketones, ur NEGATIVE  NEGATIVE mg/dL   Protein, ur NEGATIVE  NEGATIVE mg/dL   Urobilinogen, UA 0.2  0.0 - 1.0 mg/dL   Nitrite NEGATIVE  NEGATIVE   Leukocytes, UA NEGATIVE  NEGATIVE  PREGNANCY, URINE      Result Value Range   Preg Test, Ur NEGATIVE  NEGATIVE  POCT I-STAT, CHEM 8      Result Value Range   Sodium 142  135 - 145 mEq/L   Potassium 3.6  3.5 - 5.1 mEq/L   Chloride 109  96 - 112 mEq/L   BUN 10  6 - 23 mg/dL   Creatinine, Ser 7.82  0.50 - 1.10 mg/dL   Glucose, Bld 956 (*) 70 - 99 mg/dL   Calcium, Ion 2.13  0.86 - 1.23 mmol/L   TCO2 22  0 - 100 mmol/L   Hemoglobin 14.6  12.0 - 15.0 g/dL   HCT 57.8  46.9 - 62.9 %     2130:  Pt states she does not want any further investigation into her c/o frequent urination (ie: pelvic exam) while in the ED. Denies dysuria/hematuria, vaginal bleeding/discharge. States "I just want to go see my regular doctor about that."  Feels better and wants to go home now.  VSS, not orthostatic, neuro exam intact and unchanged.  Dx and testing d/w pt and family.  Questions answered.  Verb understanding, agreeable to d/c home with outpt f/u.          Laray Anger, DO 07/23/12 1300

## 2012-09-01 ENCOUNTER — Encounter: Payer: Self-pay | Admitting: *Deleted

## 2012-09-02 ENCOUNTER — Ambulatory Visit (INDEPENDENT_AMBULATORY_CARE_PROVIDER_SITE_OTHER): Payer: 59 | Admitting: Obstetrics & Gynecology

## 2012-09-02 VITALS — BP 102/60 | Ht 62.0 in | Wt 118.8 lb

## 2012-09-02 DIAGNOSIS — Z3202 Encounter for pregnancy test, result negative: Secondary | ICD-10-CM

## 2012-09-02 DIAGNOSIS — Z3049 Encounter for surveillance of other contraceptives: Secondary | ICD-10-CM

## 2012-09-02 DIAGNOSIS — Z3040 Encounter for surveillance of contraceptives, unspecified: Secondary | ICD-10-CM

## 2012-09-02 DIAGNOSIS — Z3200 Encounter for pregnancy test, result unknown: Secondary | ICD-10-CM

## 2012-09-02 MED ORDER — MEDROXYPROGESTERONE ACETATE 150 MG/ML IM SUSP
150.0000 mg | Freq: Once | INTRAMUSCULAR | Status: AC
  Administered 2012-09-02: 150 mg via INTRAMUSCULAR

## 2012-09-02 MED ORDER — MEDROXYPROGESTERONE ACETATE 150 MG/ML IM SUSP: 150.0000 mg | Freq: Once | INTRAMUSCULAR | Status: DC

## 2012-09-03 ENCOUNTER — Encounter: Payer: Self-pay | Admitting: Obstetrics & Gynecology

## 2012-09-03 NOTE — Progress Notes (Signed)
Patient ID: Victoria Mcmahon, female   DOB: 1991-02-03, 21 y.o.   MRN: [id number] Depo Provera 150 mg IM given per pt request [name] 09/03/2012 8:18 AM

## 2012-09-09 ENCOUNTER — Ambulatory Visit: Payer: 59 | Admitting: Adult Health

## 2012-09-25 ENCOUNTER — Ambulatory Visit (INDEPENDENT_AMBULATORY_CARE_PROVIDER_SITE_OTHER): Payer: 59 | Admitting: Adult Health

## 2012-09-25 ENCOUNTER — Encounter: Payer: Self-pay | Admitting: Adult Health

## 2012-09-25 VITALS — BP 92/60 | Ht 62.0 in | Wt 110.0 lb

## 2012-09-25 DIAGNOSIS — A499 Bacterial infection, unspecified: Secondary | ICD-10-CM

## 2012-09-25 DIAGNOSIS — N760 Acute vaginitis: Secondary | ICD-10-CM

## 2012-09-25 DIAGNOSIS — N898 Other specified noninflammatory disorders of vagina: Secondary | ICD-10-CM

## 2012-09-25 DIAGNOSIS — B9689 Other specified bacterial agents as the cause of diseases classified elsewhere: Secondary | ICD-10-CM

## 2012-09-25 LAB — POCT WET PREP (WET MOUNT)
Trichomonas Wet Prep HPF POC: NEGATIVE
WBC, Wet Prep HPF POC: POSITIVE

## 2012-09-25 MED ORDER — METRONIDAZOLE 500 MG PO TABS: 500.0000 mg | ORAL_TABLET | Freq: Two times a day (BID) | ORAL | Status: DC

## 2012-09-25 NOTE — Patient Instructions (Addendum)
No alcohol Take flagyl as directed Use condoms Follow up labs my phone MondayBacterial Vaginosis Bacterial vaginosis (BV) is a vaginal infection where the normal balance of bacteria in the vagina is disrupted. The normal balance is then replaced by an overgrowth of certain bacteria. There are several different kinds of bacteria that can cause BV. BV is the most common vaginal infection in women of childbearing age. CAUSES   The cause of BV is not fully understood. BV develops when there is an increase or imbalance of harmful bacteria.  Some activities or behaviors can upset the normal balance of bacteria in the vagina and put women at increased risk including:  Having a new sex partner or multiple sex partners.  Douching.  Using an intrauterine device (IUD) for contraception.  It is not clear what role sexual activity plays in the development of BV. However, women that have never had sexual intercourse are rarely infected with BV. Women do not get BV from toilet seats, bedding, swimming pools or from touching objects around them.  SYMPTOMS   Grey vaginal discharge.  A fish-like odor with discharge, especially after sexual intercourse.  Itching or burning of the vagina and vulva.  Burning or pain with urination.  Some women have no signs or symptoms at all. DIAGNOSIS  Your caregiver must examine the vagina for signs of BV. Your caregiver will perform lab tests and look at the sample of vaginal fluid through a microscope. They will look for bacteria and abnormal cells (clue cells), a pH test higher than 4.5, and a positive amine test all associated with BV.  RISKS AND COMPLICATIONS   Pelvic inflammatory disease (PID).  Infections following gynecology surgery.  Developing HIV.  Developing herpes virus. TREATMENT  Sometimes BV will clear up without treatment. However, all women with symptoms of BV should be treated to avoid complications, especially if gynecology surgery is  planned. Female partners generally do not need to be treated. However, BV may spread between female sex partners so treatment is helpful in preventing a recurrence of BV.   BV may be treated with antibiotics. The antibiotics come in either pill or vaginal cream forms. Either can be used with nonpregnant or pregnant women, but the recommended dosages differ. These antibiotics are not harmful to the baby.  BV can recur after treatment. If this happens, a second round of antibiotics will often be prescribed.  Treatment is important for pregnant women. If not treated, BV can cause a premature delivery, especially for a pregnant woman who had a premature birth in the past. All pregnant women who have symptoms of BV should be checked and treated.  For chronic reoccurrence of BV, treatment with a type of prescribed gel vaginally twice a week is helpful. HOME CARE INSTRUCTIONS   Finish all medication as directed by your caregiver.  Do not have sex until treatment is completed.  Tell your sexual partner that you have a vaginal infection. They should see their caregiver and be treated if they have problems, such as a mild rash or itching.  Practice safe sex. Use condoms. Only have 1 sex partner. PREVENTION  Basic prevention steps can help reduce the risk of upsetting the natural balance of bacteria in the vagina and developing BV:  Do not have sexual intercourse (be abstinent).  Do not douche.  Use all of the medicine prescribed for treatment of BV, even if the signs and symptoms go away.  Tell your sex partner if you have BV. That way, they  can be treated, if needed, to prevent reoccurrence. SEEK MEDICAL CARE IF:   Your symptoms are not improving after 3 days of treatment.  You have increased discharge, pain, or fever. MAKE SURE YOU:   Understand these instructions.  Will watch your condition.  Will get help right away if you are not doing well or get worse. FOR MORE INFORMATION    Division of STD Prevention (DSTDP), Centers for Disease Control and Prevention: [employer]s.[employer] American Social Health Association (ASHA): www.ashastd.org  Document Released: 05/20/2005 Document Revised: 08/12/2011 Document Reviewed: 11/10/2008 [facility] Patient Information 2013 [place], [place].  Sign up for my chart

## 2012-09-25 NOTE — Progress Notes (Signed)
Subjective:     Patient ID: Valeda Malm, female   DOB: 31-Mar-1991, 22 y.o.   MRN: [id number]  HPI Kaylana is a 22 year old black female in complaining of vaginal discharge and odor and occasional break through bleeding and small clots, she uses Depo provera for birth control. Bleeding stopped after she got Depo injection. She has had sex without a condom. No complaints of pain.  Review of Systems Complaints as above in HPI. Reviewed past medical,surgical, social and family history. Reviewed medications and allergies.     Objective:   Physical Exam Blood pressure 92/60, height 5\' 2"  (1.575 m), weight 110 lb (49.896 kg), last menstrual period 09/02/2012.Skin warm and dry.Pelvic: external genitalia is normal in appearance, vagina: white discharge with odor, cervix:smooth and bulbous, uterus: normal size, shape and contour, non tender, no masses felt, adnexa: no masses or tenderness noted. Wet prep: + for clue cells and +WBCs. GC/CHL obtained.     Assessment:    Vaginal discharge  BV    Plan:      Rx. Flagyl 1 bid x 7 days, no alcohol Use condoms   Call Monday for results of GC/CHl

## 2012-09-26 LAB — GC/CHLAMYDIA PROBE AMP: CT Probe RNA: NEGATIVE

## 2012-11-25 ENCOUNTER — Ambulatory Visit (INDEPENDENT_AMBULATORY_CARE_PROVIDER_SITE_OTHER): Payer: 59 | Admitting: Obstetrics & Gynecology

## 2012-11-25 ENCOUNTER — Encounter: Payer: Self-pay | Admitting: Obstetrics & Gynecology

## 2012-11-25 ENCOUNTER — Telehealth: Payer: Self-pay | Admitting: Adult Health

## 2012-11-25 ENCOUNTER — Other Ambulatory Visit: Payer: Self-pay | Admitting: Adult Health

## 2012-11-25 VITALS — BP 108/70 | Ht 62.0 in | Wt 120.0 lb

## 2012-11-25 DIAGNOSIS — Z309 Encounter for contraceptive management, unspecified: Secondary | ICD-10-CM

## 2012-11-25 DIAGNOSIS — Z3200 Encounter for pregnancy test, result unknown: Secondary | ICD-10-CM

## 2012-11-25 DIAGNOSIS — Z3202 Encounter for pregnancy test, result negative: Secondary | ICD-10-CM

## 2012-11-25 DIAGNOSIS — Z3049 Encounter for surveillance of other contraceptives: Secondary | ICD-10-CM

## 2012-11-25 MED ORDER — MEDROXYPROGESTERONE ACETATE 150 MG/ML IM SUSP
150.0000 mg | Freq: Once | INTRAMUSCULAR | Status: AC
  Administered 2012-11-25: 150 mg via INTRAMUSCULAR

## 2012-11-25 NOTE — Telephone Encounter (Signed)
Spoke with pt. She had a GC/CHL in April. Results were negative. Pt aware. JSY

## 2012-11-25 NOTE — Progress Notes (Signed)
Patient ID: Victoria Mcmahon, female   DOB: 1991/05/29, 22 y.o.   MRN: [id number] Pt here today for Depo Provera. Pregnancy test negative. Depo Provera 150 mg given Left Deltoid IM. No complaints or complications noted.

## 2012-12-10 ENCOUNTER — Telehealth: Payer: Self-pay | Admitting: Adult Health

## 2012-12-10 NOTE — Telephone Encounter (Signed)
Left message. JSY 

## 2012-12-11 ENCOUNTER — Telehealth: Payer: Self-pay | Admitting: Adult Health

## 2012-12-11 NOTE — Telephone Encounter (Signed)
Pt stated that she thinks she has a yeast infection. I spoke with Victorino Dike and she advised the pt to get monistat OTC and see if that is better if not to call Monday. I advised the pt of above and she verbalized understanding.

## 2013-02-17 ENCOUNTER — Ambulatory Visit: Payer: 59

## 2013-04-12 ENCOUNTER — Emergency Department (HOSPITAL_COMMUNITY)
Admission: EM | Admit: 2013-04-12 | Discharge: 2013-04-12 | Disposition: A | Discharge status: Home / Self Care | Payer: 59

## 2013-06-02 ENCOUNTER — Encounter: Payer: Self-pay | Admitting: Obstetrics and Gynecology

## 2013-06-02 ENCOUNTER — Ambulatory Visit (INDEPENDENT_AMBULATORY_CARE_PROVIDER_SITE_OTHER): Payer: 59 | Admitting: Obstetrics and Gynecology

## 2013-06-02 ENCOUNTER — Ambulatory Visit: Payer: 59 | Admitting: Obstetrics and Gynecology

## 2013-06-02 VITALS — Ht 62.0 in | Wt 121.0 lb

## 2013-06-02 VITALS — Ht 63.0 in | Wt 121.0 lb

## 2013-06-02 DIAGNOSIS — Z640 Problems related to unwanted pregnancy: Secondary | ICD-10-CM

## 2013-06-02 DIAGNOSIS — Z3201 Encounter for pregnancy test, result positive: Secondary | ICD-10-CM

## 2013-06-02 LAB — POCT URINE PREGNANCY: Preg Test, Ur: POSITIVE

## 2013-06-02 NOTE — Progress Notes (Signed)
Patient ID: COTY STUDENT, female   DOB: [birth year], 22 y.o.   MRN: [id number] Pt unsure of LMP, Does not want to continue with pregnancy. Pt advised to get dating [place] and then decide.  U/s done in the office just to clarify dating.  Pt has an IUGS 1.1 cm x 0.8 cm with a yolk sac present, consistent with early pregnancy 5.5 wks est. Pt given info on clinics that provide termination services.

## 2013-06-02 NOTE — Progress Notes (Signed)
Brief visit to document pregnancy and gest age. Pt plans to terminate. Uncertain LMP; U/s shows a 1.1 cm x 0.8 cm intrauterine gest sac with a tiny yolk sac seen, unable to see fetal pole.(Room 2 u/s).  This correlates to approx 5.5. Wks. EGA.  Pt aware of medical and surgical options and facilities

## 2013-06-30 ENCOUNTER — Encounter: Payer: 59 | Admitting: Adult Health

## 2013-07-22 ENCOUNTER — Ambulatory Visit: Payer: 59 | Admitting: Adult Health

## 2013-07-28 ENCOUNTER — Ambulatory Visit (INDEPENDENT_AMBULATORY_CARE_PROVIDER_SITE_OTHER): Payer: BC Managed Care – PPO | Admitting: Adult Health

## 2013-07-28 ENCOUNTER — Encounter: Payer: Self-pay | Admitting: Adult Health

## 2013-07-28 VITALS — BP 100/50 | Ht 62.0 in | Wt 121.0 lb

## 2013-07-28 DIAGNOSIS — Z309 Encounter for contraceptive management, unspecified: Secondary | ICD-10-CM

## 2013-07-28 DIAGNOSIS — Z3049 Encounter for surveillance of other contraceptives: Secondary | ICD-10-CM

## 2013-07-28 DIAGNOSIS — Z3202 Encounter for pregnancy test, result negative: Secondary | ICD-10-CM

## 2013-07-28 LAB — POCT URINE PREGNANCY: PREG TEST UR: NEGATIVE

## 2013-07-28 NOTE — Patient Instructions (Signed)
No sex Come in 2 days for depo

## 2013-07-28 NOTE — Progress Notes (Signed)
Subjective:     Patient ID: Victoria Mcmahon, female   DOB: Jul 08, 1990, 23 y.o.   MRN: [id number]  HPI Victoria Mcmahon is a 23 year old black female in to discuss depo.She had el ab 06/19/13 in [place], and told Marcie Bal LMP 07/04/13  and last sex 07/17/13 then changed her story when I questioned her and said period was 2/14 and she had not had sex, because she wants depo today, told her would need [facility] and sex had to be 10 days ago at least.  Review of Systems See HPI Reviewed past medical,surgical, social and family history. Reviewed medications and allergies.     Objective:   Physical Exam BP 100/50  Ht 5\' 2"  (1.575 m)  Wt 121 lb (54.885 kg)  BMI 22.13 kg/m2  LMP 02/01/2015UPT negative   See HPI for discussion, even discussed with dr Glo Herring who agrees with checking [facility] today, and will come back Friday for depo, can't come today.  Assessment:     Contraceptive management     Plan:     Check QHCG Return in 2 days for depo No sex, pt agrees

## 2013-07-29 LAB — HCG, QUANTITATIVE, PREGNANCY: HCG, BETA CHAIN, QUANT, S: 2.9 m[IU]/mL

## 2013-07-30 ENCOUNTER — Encounter: Payer: Self-pay | Admitting: Adult Health

## 2013-07-30 ENCOUNTER — Ambulatory Visit (INDEPENDENT_AMBULATORY_CARE_PROVIDER_SITE_OTHER): Payer: BC Managed Care – PPO | Admitting: Adult Health

## 2013-07-30 ENCOUNTER — Encounter (INDEPENDENT_AMBULATORY_CARE_PROVIDER_SITE_OTHER): Payer: Self-pay

## 2013-07-30 ENCOUNTER — Telehealth: Payer: Self-pay | Admitting: Adult Health

## 2013-07-30 VITALS — BP 104/58 | Ht 62.0 in | Wt 119.0 lb

## 2013-07-30 DIAGNOSIS — Z3049 Encounter for surveillance of other contraceptives: Secondary | ICD-10-CM

## 2013-07-30 DIAGNOSIS — Z3202 Encounter for pregnancy test, result negative: Secondary | ICD-10-CM

## 2013-07-30 DIAGNOSIS — Z309 Encounter for contraceptive management, unspecified: Secondary | ICD-10-CM

## 2013-07-30 LAB — POCT URINE PREGNANCY: Preg Test, Ur: NEGATIVE

## 2013-07-30 MED ORDER — MEDROXYPROGESTERONE ACETATE 150 MG/ML IM SUSP
150.0000 mg | Freq: Once | INTRAMUSCULAR | Status: AC
  Administered 2013-07-30: 150 mg via INTRAMUSCULAR

## 2013-07-30 NOTE — Progress Notes (Signed)
Patient ID: Victoria Mcmahon, female   DOB: 06-27-1990, 23 y.o.   MRN: [id number] Depo provera 150 mg given right deltoid with no complications, resulted negative pregnancy test. Pt to return in 12 weeks for next injection.

## 2013-07-30 NOTE — Telephone Encounter (Signed)
Pt coming to get depo,

## 2013-10-22 ENCOUNTER — Ambulatory Visit: Payer: BC Managed Care – PPO

## 2013-10-26 ENCOUNTER — Ambulatory Visit: Payer: BC Managed Care – PPO

## 2013-10-27 ENCOUNTER — Ambulatory Visit: Payer: BC Managed Care – PPO

## 2013-10-28 ENCOUNTER — Ambulatory Visit: Payer: BC Managed Care – PPO

## 2013-10-29 ENCOUNTER — Ambulatory Visit (INDEPENDENT_AMBULATORY_CARE_PROVIDER_SITE_OTHER): Payer: BC Managed Care – PPO | Admitting: Obstetrics & Gynecology

## 2013-10-29 ENCOUNTER — Encounter: Payer: Self-pay | Admitting: Obstetrics & Gynecology

## 2013-10-29 VITALS — BP 100/60 | Ht 62.0 in | Wt 113.5 lb

## 2013-10-29 DIAGNOSIS — Z3202 Encounter for pregnancy test, result negative: Secondary | ICD-10-CM

## 2013-10-29 DIAGNOSIS — Z3049 Encounter for surveillance of other contraceptives: Secondary | ICD-10-CM

## 2013-10-29 DIAGNOSIS — Z309 Encounter for contraceptive management, unspecified: Secondary | ICD-10-CM

## 2013-10-29 LAB — POCT URINE PREGNANCY: Preg Test, Ur: NEGATIVE

## 2013-10-29 MED ORDER — MEDROXYPROGESTERONE ACETATE 150 MG/ML IM SUSP
150.0000 mg | Freq: Once | INTRAMUSCULAR | Status: AC
  Administered 2013-10-29: 150 mg via INTRAMUSCULAR

## 2013-10-29 NOTE — Progress Notes (Signed)
Pt here for Depo shot. No complaints at this time. To return in 12 weeks for next shot. JSY 

## 2013-12-15 ENCOUNTER — Telehealth: Payer: Self-pay | Admitting: Adult Health

## 2013-12-15 NOTE — Telephone Encounter (Signed)
Left message I called call me back 

## 2013-12-16 NOTE — Telephone Encounter (Signed)
Left message I called 

## 2014-01-21 ENCOUNTER — Ambulatory Visit: Payer: BC Managed Care – PPO

## 2014-01-24 ENCOUNTER — Ambulatory Visit: Payer: BC Managed Care – PPO

## 2014-03-14 ENCOUNTER — Other Ambulatory Visit: Payer: BC Managed Care – PPO

## 2014-03-14 ENCOUNTER — Ambulatory Visit: Payer: BC Managed Care – PPO

## 2014-03-14 DIAGNOSIS — Z309 Encounter for contraceptive management, unspecified: Secondary | ICD-10-CM

## 2014-03-15 ENCOUNTER — Ambulatory Visit: Payer: BC Managed Care – PPO

## 2014-03-15 LAB — HCG, QUANTITATIVE, PREGNANCY: hCG, Beta Chain, Quant, S: <2 m[IU]/mL

## 2014-03-21 ENCOUNTER — Other Ambulatory Visit: Payer: Self-pay | Admitting: Adult Health

## 2014-03-22 ENCOUNTER — Telehealth: Payer: Self-pay | Admitting: Obstetrics & Gynecology

## 2014-03-22 NOTE — Telephone Encounter (Signed)
Pt informed RX for Depo Provera e-scribed to [place], [facility] on 03/10/2014 negative, pt states she has not had sex since that time. Pt informed no sex, urine pregnancy test tomorrow with Depo Injection per Derrek Monaco, NP.

## 2014-03-23 ENCOUNTER — Encounter: Payer: Self-pay | Admitting: Obstetrics & Gynecology

## 2014-03-23 ENCOUNTER — Ambulatory Visit: Payer: BC Managed Care – PPO

## 2014-04-04 ENCOUNTER — Encounter: Payer: Self-pay | Admitting: Obstetrics & Gynecology

## 2014-04-08 ENCOUNTER — Encounter (HOSPITAL_COMMUNITY): Payer: Self-pay | Admitting: *Deleted

## 2014-04-08 ENCOUNTER — Emergency Department (HOSPITAL_COMMUNITY)
Admission: EM | Admit: 2014-04-08 | Discharge: 2014-04-08 | Discharge status: Against Medical Advice | Payer: BC Managed Care – PPO | Attending: Emergency Medicine | Admitting: Emergency Medicine

## 2014-04-08 DIAGNOSIS — W109XXA Fall (on) (from) unspecified stairs and steps, initial encounter: Secondary | ICD-10-CM | POA: Insufficient documentation

## 2014-04-08 DIAGNOSIS — S199XXA Unspecified injury of neck, initial encounter: Secondary | ICD-10-CM | POA: Insufficient documentation

## 2014-04-08 DIAGNOSIS — Y9289 Other specified places as the place of occurrence of the external cause: Secondary | ICD-10-CM | POA: Insufficient documentation

## 2014-04-08 DIAGNOSIS — H53149 Visual discomfort, unspecified: Secondary | ICD-10-CM | POA: Diagnosis not present

## 2014-04-08 DIAGNOSIS — Y9389 Activity, other specified: Secondary | ICD-10-CM | POA: Diagnosis not present

## 2014-04-08 DIAGNOSIS — S0083XA Contusion of other part of head, initial encounter: Secondary | ICD-10-CM | POA: Diagnosis not present

## 2014-04-08 DIAGNOSIS — S0990XA Unspecified injury of head, initial encounter: Secondary | ICD-10-CM | POA: Diagnosis present

## 2014-04-08 NOTE — ED Notes (Signed)
Called x 1 with no answer. 

## 2014-04-08 NOTE — ED Notes (Addendum)
Tripped on step last night, falling forward, hitting head on other steps.  Has contusion above R brow and c/o HA and neck pain.  Denies LOC.  Reports photophobia.  States HA kept her up all night. Has not taken anything for pain.

## 2014-04-08 NOTE — ED Notes (Signed)
PT called x3 and no answer.

## 2014-11-22 ENCOUNTER — Other Ambulatory Visit: Payer: Medicaid Other | Admitting: Adult Health

## 2015-03-01 ENCOUNTER — Telehealth: Payer: Self-pay | Admitting: Advanced Practice Midwife

## 2015-03-01 LAB — OB RESULTS CONSOLE GC/CHLAMYDIA
Chlamydia: NEGATIVE
Gonorrhea: NEGATIVE

## 2015-03-01 LAB — OB RESULTS CONSOLE HGB/HCT, BLOOD
HEMATOCRIT: 41 %
Hemoglobin: 14.4 g/dL

## 2015-03-01 LAB — OB RESULTS CONSOLE PLATELET COUNT: PLATELETS: 318 10*3/uL

## 2015-03-01 NOTE — Telephone Encounter (Signed)
Pt states she had a positive pregnancy test done at [facility] but she is unsure of when her last period was, she cant remember if it was early Aug or mid to late Aug and is wanting to know if we could tell her how far along she is.  I explained to her that when we do her dating U/S we can tell her approximately when she would have conceived and call was transferred to front staff for an appointment to be scheduled.

## 2015-03-02 LAB — OB RESULTS CONSOLE TSH: TSH: 0.978

## 2015-03-03 ENCOUNTER — Other Ambulatory Visit: Payer: Self-pay | Admitting: Obstetrics and Gynecology

## 2015-03-03 DIAGNOSIS — O3680X0 Pregnancy with inconclusive fetal viability, not applicable or unspecified: Secondary | ICD-10-CM

## 2015-03-06 ENCOUNTER — Ambulatory Visit (INDEPENDENT_AMBULATORY_CARE_PROVIDER_SITE_OTHER): Payer: Medicaid Other

## 2015-03-06 DIAGNOSIS — O3680X0 Pregnancy with inconclusive fetal viability, not applicable or unspecified: Secondary | ICD-10-CM

## 2015-03-06 NOTE — Progress Notes (Signed)
[place] 6+4wks single IUP w/ys,pos fht 123bpm,normal ov's bilat

## 2015-03-14 ENCOUNTER — Encounter: Payer: Self-pay | Admitting: Women's Health

## 2015-03-14 ENCOUNTER — Encounter: Payer: Medicaid Other | Admitting: Women's Health

## 2015-04-03 ENCOUNTER — Encounter: Payer: Self-pay | Admitting: Women's Health

## 2015-04-03 ENCOUNTER — Encounter: Payer: Medicaid Other | Admitting: Women's Health

## 2015-05-03 ENCOUNTER — Encounter: Payer: Self-pay | Admitting: *Deleted

## 2015-05-18 ENCOUNTER — Encounter: Payer: Medicaid Other | Admitting: Women's Health

## 2015-05-30 ENCOUNTER — Encounter: Payer: Medicaid Other | Admitting: Advanced Practice Midwife

## 2015-06-14 ENCOUNTER — Encounter: Payer: Self-pay | Admitting: *Deleted

## 2016-01-09 ENCOUNTER — Encounter (HOSPITAL_COMMUNITY): Payer: Self-pay

## 2016-02-19 ENCOUNTER — Encounter: Payer: Self-pay | Admitting: Obstetrics & Gynecology

## 2016-02-19 ENCOUNTER — Encounter: Payer: Medicaid Other | Admitting: *Deleted

## 2016-02-19 ENCOUNTER — Other Ambulatory Visit: Payer: Medicaid Other

## 2016-02-19 ENCOUNTER — Telehealth: Payer: Self-pay | Admitting: Obstetrics and Gynecology

## 2016-02-19 DIAGNOSIS — Z3042 Encounter for surveillance of injectable contraceptive: Secondary | ICD-10-CM

## 2016-02-19 LAB — BETA HCG QUANT (REF LAB): hCG Quant: <1 m[IU]/mL

## 2016-02-19 MED ORDER — MEDROXYPROGESTERONE ACETATE 150 MG/ML IM SUSP: 150.0000 mg | INTRAMUSCULAR | 4 refills | Status: DC

## 2016-02-19 NOTE — Telephone Encounter (Signed)
Refilled depo 

## 2016-02-19 NOTE — Progress Notes (Signed)
Pt didn't have Depo so she went to pharmacy to pick up. Pt didn't have any refills left. Refills added. Pt to reschedule to tomorrow. [place]

## 2016-02-20 ENCOUNTER — Ambulatory Visit: Payer: Medicaid Other

## 2016-04-17 ENCOUNTER — Ambulatory Visit: Payer: Medicaid Other

## 2016-04-17 ENCOUNTER — Other Ambulatory Visit: Payer: Medicaid Other

## 2016-04-17 ENCOUNTER — Telehealth: Payer: Self-pay | Admitting: *Deleted

## 2016-04-17 DIAGNOSIS — Z308 Encounter for other contraceptive management: Secondary | ICD-10-CM

## 2016-04-17 LAB — BETA HCG QUANT (REF LAB)

## 2016-04-17 NOTE — Telephone Encounter (Signed)
Pt informed not due for depo provera until 05/2016. Pt verbalized understanding.

## 2016-06-17 ENCOUNTER — Other Ambulatory Visit: Payer: Medicaid Other

## 2016-06-17 ENCOUNTER — Ambulatory Visit: Payer: Medicaid Other

## 2016-06-18 ENCOUNTER — Ambulatory Visit: Payer: Medicaid Other

## 2016-06-18 ENCOUNTER — Other Ambulatory Visit: Payer: Medicaid Other

## 2016-06-18 DIAGNOSIS — Z3042 Encounter for surveillance of injectable contraceptive: Secondary | ICD-10-CM

## 2016-06-18 LAB — BETA HCG QUANT (REF LAB)

## 2016-06-19 ENCOUNTER — Ambulatory Visit: Payer: Medicaid Other

## 2016-06-21 ENCOUNTER — Ambulatory Visit (INDEPENDENT_AMBULATORY_CARE_PROVIDER_SITE_OTHER): Payer: Medicaid Other | Admitting: *Deleted

## 2016-06-21 ENCOUNTER — Encounter: Payer: Self-pay | Admitting: *Deleted

## 2016-06-21 ENCOUNTER — Ambulatory Visit: Payer: Medicaid Other

## 2016-06-21 DIAGNOSIS — Z3042 Encounter for surveillance of injectable contraceptive: Secondary | ICD-10-CM

## 2016-06-21 DIAGNOSIS — Z308 Encounter for other contraceptive management: Secondary | ICD-10-CM

## 2016-06-21 MED ORDER — MEDROXYPROGESTERONE ACETATE 150 MG/ML IM SUSP
150.0000 mg | Freq: Once | INTRAMUSCULAR | Status: AC
  Administered 2016-06-21: 150 mg via INTRAMUSCULAR

## 2016-06-21 NOTE — Progress Notes (Addendum)
Pt here for Depo. Pt had negative quant on 06/18/16 and hasn't had sex. JAG advised ok to give shot today.Pt tolerated shot well. Return in 12 weeks for next shot. Schedule Pap and Physical. JSY

## 2016-07-03 ENCOUNTER — Other Ambulatory Visit: Payer: Medicaid Other | Admitting: Adult Health

## 2016-09-13 ENCOUNTER — Encounter (INDEPENDENT_AMBULATORY_CARE_PROVIDER_SITE_OTHER): Payer: Self-pay

## 2016-09-13 ENCOUNTER — Encounter: Payer: Self-pay | Admitting: *Deleted

## 2016-09-13 ENCOUNTER — Ambulatory Visit (INDEPENDENT_AMBULATORY_CARE_PROVIDER_SITE_OTHER): Payer: Medicaid Other | Admitting: *Deleted

## 2016-09-13 DIAGNOSIS — Z3202 Encounter for pregnancy test, result negative: Secondary | ICD-10-CM | POA: Diagnosis not present

## 2016-09-13 DIAGNOSIS — Z3042 Encounter for surveillance of injectable contraceptive: Secondary | ICD-10-CM | POA: Diagnosis not present

## 2016-09-13 DIAGNOSIS — Z308 Encounter for other contraceptive management: Secondary | ICD-10-CM

## 2016-09-13 LAB — POCT URINE PREGNANCY: Preg Test, Ur: NEGATIVE

## 2016-09-13 MED ORDER — MEDROXYPROGESTERONE ACETATE 150 MG/ML IM SUSP
150.0000 mg | Freq: Once | INTRAMUSCULAR | Status: AC
  Administered 2016-09-13: 150 mg via INTRAMUSCULAR

## 2016-09-13 NOTE — Progress Notes (Signed)
Pt here for Depo. Pt tolerated shot well. Return in 12 weeks for next shot. JSY 

## 2016-12-06 ENCOUNTER — Ambulatory Visit (INDEPENDENT_AMBULATORY_CARE_PROVIDER_SITE_OTHER): Payer: Medicaid Other

## 2016-12-06 VITALS — Wt <= 1120 oz

## 2016-12-06 DIAGNOSIS — Z3202 Encounter for pregnancy test, result negative: Secondary | ICD-10-CM

## 2016-12-06 DIAGNOSIS — Z3042 Encounter for surveillance of injectable contraceptive: Secondary | ICD-10-CM

## 2016-12-06 LAB — POCT URINE PREGNANCY: Preg Test, Ur: NEGATIVE

## 2016-12-06 MED ORDER — MEDROXYPROGESTERONE ACETATE 150 MG/ML IM SUSP
150.0000 mg | Freq: Once | INTRAMUSCULAR | Status: AC
  Administered 2016-12-06: 150 mg via INTRAMUSCULAR

## 2016-12-06 NOTE — Progress Notes (Signed)
PT here for depo shot 150 mg IM given RT Deltoid. Tolerated well. Return 12 week for next shot. Pad CMA

## 2017-01-07 ENCOUNTER — Encounter (HOSPITAL_COMMUNITY): Payer: Self-pay | Admitting: *Deleted

## 2017-01-07 ENCOUNTER — Emergency Department (HOSPITAL_COMMUNITY)
Admission: EM | Admit: 2017-01-07 | Discharge: 2017-01-07 | Disposition: A | Discharge status: Home / Self Care | Payer: Medicaid Other | Attending: Emergency Medicine | Admitting: Emergency Medicine

## 2017-01-07 DIAGNOSIS — S0993XA Unspecified injury of face, initial encounter: Secondary | ICD-10-CM | POA: Diagnosis present

## 2017-01-07 DIAGNOSIS — Y939 Activity, unspecified: Secondary | ICD-10-CM | POA: Diagnosis not present

## 2017-01-07 DIAGNOSIS — Y92410 Unspecified street and highway as the place of occurrence of the external cause: Secondary | ICD-10-CM | POA: Diagnosis not present

## 2017-01-07 DIAGNOSIS — S00511A Abrasion of lip, initial encounter: Secondary | ICD-10-CM | POA: Diagnosis not present

## 2017-01-07 DIAGNOSIS — Y999 Unspecified external cause status: Secondary | ICD-10-CM | POA: Diagnosis not present

## 2017-01-07 MED ORDER — IBUPROFEN 600 MG PO TABS: 600.0000 mg | ORAL_TABLET | Freq: Four times a day (QID) | ORAL | 0 refills | Status: DC | PRN

## 2017-01-07 NOTE — Discharge Instructions (Signed)
Return if any problems.
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
## 2017-02-28 ENCOUNTER — Ambulatory Visit: Payer: Medicaid Other

## 2017-03-03 ENCOUNTER — Encounter: Payer: Self-pay | Admitting: *Deleted

## 2017-03-03 ENCOUNTER — Ambulatory Visit: Payer: Self-pay

## 2017-04-08 ENCOUNTER — Ambulatory Visit: Payer: Self-pay

## 2017-04-08 ENCOUNTER — Other Ambulatory Visit: Payer: Self-pay

## 2017-04-15 ENCOUNTER — Other Ambulatory Visit: Payer: Self-pay

## 2017-04-15 ENCOUNTER — Encounter: Payer: Self-pay | Admitting: Obstetrics & Gynecology

## 2017-04-15 ENCOUNTER — Ambulatory Visit: Payer: Self-pay

## 2017-05-01 ENCOUNTER — Ambulatory Visit: Payer: Self-pay

## 2017-05-01 ENCOUNTER — Other Ambulatory Visit: Payer: Medicaid Other

## 2017-05-08 ENCOUNTER — Encounter: Payer: Self-pay | Admitting: Adult Health

## 2017-05-08 ENCOUNTER — Other Ambulatory Visit (HOSPITAL_COMMUNITY)
Admission: RE | Admit: 2017-05-08 | Discharge: 2017-05-08 | Disposition: A | Discharge status: Home / Self Care | Payer: Medicaid Other | Source: Ambulatory Visit | Attending: Adult Health | Admitting: Adult Health

## 2017-05-08 ENCOUNTER — Ambulatory Visit (INDEPENDENT_AMBULATORY_CARE_PROVIDER_SITE_OTHER): Payer: Medicaid Other | Admitting: Adult Health

## 2017-05-08 VITALS — BP 110/60 | HR 97 | Ht 62.0 in | Wt 131.0 lb

## 2017-05-08 DIAGNOSIS — Z3202 Encounter for pregnancy test, result negative: Secondary | ICD-10-CM | POA: Diagnosis not present

## 2017-05-08 DIAGNOSIS — Z113 Encounter for screening for infections with a predominantly sexual mode of transmission: Secondary | ICD-10-CM

## 2017-05-08 DIAGNOSIS — Z309 Encounter for contraceptive management, unspecified: Secondary | ICD-10-CM | POA: Diagnosis not present

## 2017-05-08 DIAGNOSIS — Z01419 Encounter for gynecological examination (general) (routine) without abnormal findings: Secondary | ICD-10-CM | POA: Insufficient documentation

## 2017-05-08 DIAGNOSIS — Z3009 Encounter for other general counseling and advice on contraception: Secondary | ICD-10-CM

## 2017-05-08 DIAGNOSIS — Z30011 Encounter for initial prescription of contraceptive pills: Secondary | ICD-10-CM | POA: Insufficient documentation

## 2017-05-08 LAB — POCT URINE PREGNANCY: Preg Test, Ur: NEGATIVE

## 2017-05-08 MED ORDER — NORETHIN-ETH ESTRAD-FE BIPHAS 1 MG-10 MCG / 10 MCG PO TABS: 1.0000 | ORAL_TABLET | Freq: Every day | ORAL | 11 refills | Status: DC

## 2017-05-08 NOTE — Addendum Note (Signed)
Addended by: Diona Fanti A on: 05/08/2017 05:11 PM   Modules accepted: Orders

## 2017-05-08 NOTE — Progress Notes (Signed)
Patient ID: Victoria Mcmahon, female   DOB: 02/03/1991, 26 y.o.   MRN: [id number] History of Present Illness: Victoria Mcmahon is a 26 year old black female, in for well woman gyn exam and pap.She is off depo and wants tubes tied.    Current Medications, Allergies, Past Medical History, Past Surgical History, Family History and Social History were reviewed in [employer] record.     Review of Systems:  Patient denies any headaches, hearing loss, fatigue, blurred vision, shortness of breath, chest pain, abdominal pain, problems with bowel movements, urination, or intercourse. No joint pain or mood swings.  Physical Exam:BP 110/60 (BP Location: Left Arm, Patient Position: Sitting, Cuff Size: Small)   Pulse 97   Ht 5\' 2"  (1.575 m)   Wt 131 lb (59.4 kg)   BMI 23.96 kg/m UPT negative. General:  Well developed, well nourished, no acute distress Skin:  Warm and dry Neck:  Midline trachea, normal thyroid, good ROM, no lymphadenopathy Lungs; Clear to auscultation bilaterally Breast:  No dominant palpable mass, retraction, or nipple discharge Cardiovascular: Regular rate and rhythm Abdomen:  Soft, non tender, no hepatosplenomegaly Pelvic:  External genitalia is normal in appearance, no lesions.  The vagina is normal in appearance. Urethra has no lesions or masses. The cervix is bulbous. Pap with Gc/CHL performed. Uterus is felt to be normal size, shape, and contour.  No adnexal masses or tenderness noted.Bladder is non tender, no masses felt. Extremities/musculoskeletal:  No swelling or varicosities noted, no clubbing or cyanosis Psych:  No mood changes, alert and cooperative,seems happy PHQ 9 score 2.Will rx lo loestrin,use condoms and get tubal papers signed.   Impression:  1. Encounter for gynecological examination with Papanicolaou smear of cervix   2. Pregnancy examination or test, negative result   3. Encounter for initial prescription of contraceptive pills   4. Family  planning   5. Screening examination for STD (sexually transmitted disease)      Plan: Check HIV and RPR Meds ordered this encounter  Medications  . Norethindrone-Ethinyl Estradiol-Fe Biphas (LO LOESTRIN FE) 1 MG-10 MCG / 10 MCG tablet    Sig: Take 1 tablet by mouth daily. Take 1 daily by mouth    Dispense:  1 Package    Refill:  11    BIN [id number], PCN [id number], GRP [id number]    Order Specific Question:   Supervising Provider    Answer:   Florian Buff [[id number]]  Start today lo loestrin Use condoms Sign tubal papers Review handout by Tilden Fossa on tubal Return in 4 weeks for pre op with Dr Elonda Husky for tubal Physical in 1 year Pap in 3 if normal

## 2017-05-09 LAB — RPR: RPR Ser Ql: NONREACTIVE

## 2017-05-09 LAB — HIV ANTIBODY (ROUTINE TESTING W REFLEX): HIV SCREEN 4TH GENERATION: NONREACTIVE

## 2017-05-13 ENCOUNTER — Other Ambulatory Visit: Payer: Medicaid Other

## 2017-05-13 ENCOUNTER — Ambulatory Visit: Payer: Self-pay

## 2017-05-13 LAB — CYTOLOGY - PAP
CHLAMYDIA, DNA PROBE: NEGATIVE
Diagnosis: NEGATIVE
NEISSERIA GONORRHEA: NEGATIVE

## 2017-05-15 ENCOUNTER — Ambulatory Visit: Payer: Self-pay

## 2017-05-15 ENCOUNTER — Other Ambulatory Visit: Payer: Medicaid Other

## 2017-05-15 DIAGNOSIS — Z3042 Encounter for surveillance of injectable contraceptive: Secondary | ICD-10-CM

## 2017-05-15 LAB — BETA HCG QUANT (REF LAB): hCG Quant: <1 m[IU]/mL

## 2017-05-16 ENCOUNTER — Ambulatory Visit (INDEPENDENT_AMBULATORY_CARE_PROVIDER_SITE_OTHER): Payer: Medicaid Other

## 2017-05-16 ENCOUNTER — Telehealth: Payer: Self-pay | Admitting: *Deleted

## 2017-05-16 VITALS — Wt 129.2 lb

## 2017-05-16 DIAGNOSIS — Z3042 Encounter for surveillance of injectable contraceptive: Secondary | ICD-10-CM

## 2017-05-16 MED ORDER — MEDROXYPROGESTERONE ACETATE 150 MG/ML IM SUSP: 150.0000 mg | INTRAMUSCULAR | 3 refills | Status: DC

## 2017-05-16 MED ORDER — MEDROXYPROGESTERONE ACETATE 150 MG/ML IM SUSP
150.0000 mg | Freq: Once | INTRAMUSCULAR | Status: AC
  Administered 2017-05-16: 150 mg via INTRAMUSCULAR

## 2017-05-16 NOTE — Telephone Encounter (Signed)
Patient wants to switch to Depo. Please advise.

## 2017-05-16 NOTE — Telephone Encounter (Signed)
Pt changed her mind wants depo, will rx come in at 8 n for injection

## 2017-05-16 NOTE — Progress Notes (Addendum)
PT here for depo shot 150 mg IM given lt deltoid. Tolerated well. Return 12 weeks for next shot. Serum HCG negative.pad CMA

## 2017-08-08 ENCOUNTER — Ambulatory Visit (INDEPENDENT_AMBULATORY_CARE_PROVIDER_SITE_OTHER): Payer: Medicaid Other

## 2017-08-08 VITALS — Ht 62.0 in | Wt 128.0 lb

## 2017-08-08 DIAGNOSIS — Z3202 Encounter for pregnancy test, result negative: Secondary | ICD-10-CM

## 2017-08-08 DIAGNOSIS — Z3042 Encounter for surveillance of injectable contraceptive: Secondary | ICD-10-CM

## 2017-08-08 DIAGNOSIS — Z3049 Encounter for surveillance of other contraceptives: Secondary | ICD-10-CM | POA: Diagnosis not present

## 2017-08-08 LAB — POCT URINE PREGNANCY: PREG TEST UR: NEGATIVE

## 2017-08-08 MED ORDER — MEDROXYPROGESTERONE ACETATE 150 MG/ML IM SUSP
150.0000 mg | Freq: Once | INTRAMUSCULAR | Status: AC
  Administered 2017-08-08: 150 mg via INTRAMUSCULAR

## 2017-08-08 NOTE — Addendum Note (Signed)
Addended by: Diona Fanti A on: 08/08/2017 11:05 AM   Modules accepted: Level of Service

## 2017-08-08 NOTE — Progress Notes (Signed)
Pt here for  Depo injection 150 mg IM given rt deltoid. Tolerated well. Return 12 weeks for next shot. Pad CMA

## 2017-10-31 ENCOUNTER — Ambulatory Visit: Payer: Medicaid Other

## 2017-12-16 ENCOUNTER — Other Ambulatory Visit: Payer: Self-pay

## 2017-12-16 ENCOUNTER — Ambulatory Visit: Payer: Medicaid Other

## 2017-12-31 ENCOUNTER — Other Ambulatory Visit: Payer: Self-pay

## 2017-12-31 ENCOUNTER — Ambulatory Visit: Payer: Self-pay

## 2018-01-01 ENCOUNTER — Other Ambulatory Visit: Payer: Self-pay

## 2018-01-05 ENCOUNTER — Other Ambulatory Visit: Payer: Self-pay

## 2018-01-05 ENCOUNTER — Ambulatory Visit: Payer: Medicaid Other

## 2018-01-06 ENCOUNTER — Ambulatory Visit: Payer: Medicaid Other

## 2018-01-06 ENCOUNTER — Other Ambulatory Visit: Payer: Medicaid Other

## 2018-01-06 LAB — BETA HCG QUANT (REF LAB): hCG Quant: <1 m[IU]/mL

## 2018-01-08 ENCOUNTER — Other Ambulatory Visit: Payer: Self-pay

## 2018-01-08 ENCOUNTER — Ambulatory Visit (INDEPENDENT_AMBULATORY_CARE_PROVIDER_SITE_OTHER): Payer: Medicaid Other | Admitting: *Deleted

## 2018-01-08 ENCOUNTER — Encounter: Payer: Self-pay | Admitting: *Deleted

## 2018-01-08 DIAGNOSIS — Z3042 Encounter for surveillance of injectable contraceptive: Secondary | ICD-10-CM | POA: Diagnosis not present

## 2018-01-08 DIAGNOSIS — Z3202 Encounter for pregnancy test, result negative: Secondary | ICD-10-CM

## 2018-01-08 LAB — POCT URINE PREGNANCY: Preg Test, Ur: NEGATIVE

## 2018-01-08 MED ORDER — MEDROXYPROGESTERONE ACETATE 150 MG/ML IM SUSP
150.0000 mg | Freq: Once | INTRAMUSCULAR | Status: AC
  Administered 2018-01-08: 150 mg via INTRAMUSCULAR

## 2018-01-08 NOTE — Progress Notes (Signed)
Pt given DepoProvera 150mg  IM right deltoid without complications. Advised to return in 12 weeks for next injection.  Pt missed last appt. Has not had intercourse in last 2 weeks and is on period

## 2018-03-24 ENCOUNTER — Ambulatory Visit: Payer: Medicaid Other | Admitting: Adult Health

## 2018-04-02 ENCOUNTER — Encounter (INDEPENDENT_AMBULATORY_CARE_PROVIDER_SITE_OTHER): Payer: Self-pay

## 2018-04-02 ENCOUNTER — Encounter: Payer: Self-pay | Admitting: Adult Health

## 2018-04-02 ENCOUNTER — Ambulatory Visit (INDEPENDENT_AMBULATORY_CARE_PROVIDER_SITE_OTHER): Payer: Medicaid Other | Admitting: Adult Health

## 2018-04-02 ENCOUNTER — Ambulatory Visit: Payer: Medicaid Other

## 2018-04-02 VITALS — BP 104/71 | HR 75 | Ht 62.0 in | Wt 135.0 lb

## 2018-04-02 DIAGNOSIS — Z3202 Encounter for pregnancy test, result negative: Secondary | ICD-10-CM

## 2018-04-02 DIAGNOSIS — N898 Other specified noninflammatory disorders of vagina: Secondary | ICD-10-CM | POA: Diagnosis not present

## 2018-04-02 DIAGNOSIS — Z3042 Encounter for surveillance of injectable contraceptive: Secondary | ICD-10-CM | POA: Diagnosis not present

## 2018-04-02 DIAGNOSIS — Z113 Encounter for screening for infections with a predominantly sexual mode of transmission: Secondary | ICD-10-CM | POA: Diagnosis not present

## 2018-04-02 LAB — POCT URINE PREGNANCY: Preg Test, Ur: NEGATIVE

## 2018-04-02 MED ORDER — MEDROXYPROGESTERONE ACETATE 150 MG/ML IM SUSP
150.0000 mg | Freq: Once | INTRAMUSCULAR | Status: AC
  Administered 2018-04-02: 150 mg via INTRAMUSCULAR

## 2018-04-02 MED ORDER — MEDROXYPROGESTERONE ACETATE 150 MG/ML IM SUSP: 150.0000 mg | INTRAMUSCULAR | 3 refills | Status: DC

## 2018-04-02 NOTE — Progress Notes (Signed)
  Subjective:     Patient ID: Victoria Mcmahon, female   DOB: 1991/05/18, 27 y.o.   MRN: [id number]  HPI Victoria Mcmahon is a 27 year old black female in for depo today and requests STD testing.Has noticed discharge.  Review of Systems +vaginal discharge Reviewed past medical,surgical, social and family history. Reviewed medications and allergies.     Objective:   Physical Exam BP 104/71 (BP Location: Left Arm, Patient Position: Sitting, Cuff Size: Normal)   Pulse 75   Ht 5\' 2"  (1.575 m)   Wt 135 lb (61.2 kg)   BMI 24.69 kg/m  Skin warm and dry.Pelvic: external genitalia is normal in appearance no lesions, vagina: tan discharge without odor,urethra has no lesions or masses noted, cervix:smooth and bulbous, uterus: normal size, shape and contour, non tender, no masses felt, adnexa: no masses or tenderness noted. Bladder is non tender and no masses felt. Examination chaperoned by Estill Bamberg Rash LPN. UPT given and depo given by A. Rash LPN. Nuswab obtained.    Assessment:      1. Screening examination for STD (sexually transmitted disease)   2. Vaginal discharge   3. Depo-Provera contraceptive status     Plan:     Nuswab sent Depo in 12 weeks Use condoms  Meds ordered this encounter  Medications  . medroxyPROGESTERone (DEPO-PROVERA) injection 150 mg  . medroxyPROGESTERone (DEPO-PROVERA) 150 MG/ML injection    Sig: Inject 1 mL (150 mg total) into the muscle every 3 (three) months.    Dispense:  1 mL    Refill:  3    Order Specific Question:   Supervising Provider    Answer:   Tania Ade H [[id number]]

## 2018-04-02 NOTE — Progress Notes (Signed)
Pt given depo provera 150 mg IM in left deltoid. Patient tolerated well. Next dose in 12 weeks.

## 2018-04-04 LAB — NUSWAB VAGINITIS PLUS (VG+)
Atopobium vaginae: HIGH Score — AB
CANDIDA ALBICANS, NAA: NEGATIVE
CHLAMYDIA TRACHOMATIS, NAA: NEGATIVE
Candida glabrata, NAA: NEGATIVE
MEGASPHAERA 1: HIGH {score} — AB
Neisseria gonorrhoeae, NAA: NEGATIVE
Trich vag by NAA: POSITIVE — AB

## 2018-04-06 ENCOUNTER — Encounter: Payer: Self-pay | Admitting: Adult Health

## 2018-04-06 ENCOUNTER — Telehealth: Payer: Self-pay | Admitting: Adult Health

## 2018-04-06 DIAGNOSIS — A599 Trichomoniasis, unspecified: Secondary | ICD-10-CM | POA: Insufficient documentation

## 2018-04-06 HISTORY — DX: Trichomoniasis, unspecified: A59.9

## 2018-04-06 NOTE — Telephone Encounter (Signed)
Left message to call me back about nuswab, has BV and trich and needs to be treated.

## 2018-04-07 ENCOUNTER — Telehealth: Payer: Self-pay | Admitting: Adult Health

## 2018-04-07 MED ORDER — METRONIDAZOLE 500 MG PO TABS: 500.0000 mg | ORAL_TABLET | Freq: Three times a day (TID) | ORAL | 0 refills | Status: DC

## 2018-04-07 NOTE — Telephone Encounter (Signed)
Pt aware that nuswab showed BV and trich, will rx flagyl 500 mg 1 tid for 7 days, no sex, POC 11/19 at 10:45, and partner needs to be treated too, she said she would tell him

## 2018-04-21 ENCOUNTER — Ambulatory Visit: Payer: Medicaid Other | Admitting: Adult Health

## 2018-05-12 ENCOUNTER — Telehealth: Payer: Self-pay | Admitting: *Deleted

## 2018-05-12 NOTE — Telephone Encounter (Signed)
LMOVM that pt would need to schedule an appt for refill on medication for STD

## 2018-05-13 ENCOUNTER — Encounter: Payer: Self-pay | Admitting: Adult Health

## 2018-05-13 ENCOUNTER — Encounter (INDEPENDENT_AMBULATORY_CARE_PROVIDER_SITE_OTHER): Payer: Self-pay

## 2018-05-13 ENCOUNTER — Ambulatory Visit (INDEPENDENT_AMBULATORY_CARE_PROVIDER_SITE_OTHER): Payer: Medicaid Other | Admitting: Adult Health

## 2018-05-13 VITALS — BP 94/58 | HR 79 | Ht 62.0 in | Wt 135.0 lb

## 2018-05-13 DIAGNOSIS — Z09 Encounter for follow-up examination after completed treatment for conditions other than malignant neoplasm: Secondary | ICD-10-CM

## 2018-05-13 DIAGNOSIS — Z8619 Personal history of other infectious and parasitic diseases: Secondary | ICD-10-CM

## 2018-05-13 LAB — POCT WET PREP (WET MOUNT)
Clue Cells Wet Prep Whiff POC: NEGATIVE
Trichomonas Wet Prep HPF POC: ABSENT

## 2018-05-13 NOTE — Progress Notes (Signed)
  Subjective:     Patient ID: Victoria Mcmahon, female   DOB: 06-28-1990, 27 y.o.   MRN: [id number]  HPI Victoria Mcmahon is a 27 year old black female, back in follow up fpr proof of treatment for recent trich and BV.   Review of Systems +odor at times Uses ph balanced shower gel  Reviewed past medical,surgical, social and family history. Reviewed medications and allergies.     Objective:   Physical Exam BP (!) 94/58 (BP Location: Left Arm, Patient Position: Sitting, Cuff Size: Normal)   Pulse 79   Ht 5\' 2"  (1.575 m)   Wt 135 lb (61.2 kg)   BMI 24.69 kg/m   Skin warm and dry.Pelvic: external genitalia is normal in appearance no lesions, vagina: scant white discharge without odor,urethra has no lesions or masses noted, cervix:smooth and bulbous, uterus: normal size, shape and contour, non tender, no masses felt, adnexa: no masses or tenderness noted. Bladder is non tender and no masses felt. Wet prep: negative.     Assessment:     1. History of trichomoniasis       Plan:     Use white bar soap like dial F/U prn

## 2018-06-23 ENCOUNTER — Telehealth: Payer: Self-pay | Admitting: Adult Health

## 2018-06-23 NOTE — Telephone Encounter (Signed)
Left message @ 8:57 am. JSY

## 2018-06-23 NOTE — Telephone Encounter (Signed)
Patient called, she has an appointment for her depo on Thursday.  She would like to talk to a nurse about it, she has questions about types of depos.  [phone]

## 2018-06-23 NOTE — Telephone Encounter (Signed)
Spoke with Victoria Mcmahon letting her know we give Depo Provera. Victoria Mcmahon was wanting to make sure that's what she was taking. [place]

## 2018-06-25 ENCOUNTER — Ambulatory Visit: Payer: Medicaid Other

## 2018-07-07 ENCOUNTER — Other Ambulatory Visit: Payer: Medicaid Other

## 2018-07-07 ENCOUNTER — Ambulatory Visit: Payer: Medicaid Other

## 2018-07-07 DIAGNOSIS — Z3042 Encounter for surveillance of injectable contraceptive: Secondary | ICD-10-CM

## 2018-07-07 LAB — BETA HCG QUANT (REF LAB): hCG Quant: <1 m[IU]/mL

## 2018-07-08 ENCOUNTER — Ambulatory Visit (INDEPENDENT_AMBULATORY_CARE_PROVIDER_SITE_OTHER): Payer: Medicaid Other | Admitting: *Deleted

## 2018-07-08 VITALS — BP 114/73 | HR 90

## 2018-07-08 DIAGNOSIS — Z3042 Encounter for surveillance of injectable contraceptive: Secondary | ICD-10-CM

## 2018-07-08 MED ORDER — MEDROXYPROGESTERONE ACETATE 150 MG/ML IM SUSP
150.0000 mg | Freq: Once | INTRAMUSCULAR | Status: AC
  Administered 2018-07-08: 150 mg via INTRAMUSCULAR

## 2018-07-08 NOTE — Progress Notes (Signed)
Pt in for depo injection. HCG negative. Depo Provera 150 mg given IM in right deltoid. Patient tolerated well next dose in 12 weeks.

## 2018-08-24 ENCOUNTER — Telehealth: Payer: Self-pay | Admitting: *Deleted

## 2018-08-24 NOTE — Telephone Encounter (Signed)
LMOVM that no visitors or children are allowed at appt. Advised that if she had fever, cough, SOB, muscle pain, diarrhea, rash, vomiting, abdominal pain, red eye, weakness, bruising or bleeding, joint pain or severe headache, to call the office before coming in to her appt

## 2018-08-25 ENCOUNTER — Ambulatory Visit: Payer: Self-pay | Admitting: Obstetrics & Gynecology

## 2018-08-26 DIAGNOSIS — Z1389 Encounter for screening for other disorder: Secondary | ICD-10-CM | POA: Diagnosis not present

## 2018-08-26 DIAGNOSIS — R5383 Other fatigue: Secondary | ICD-10-CM | POA: Diagnosis not present

## 2018-08-26 DIAGNOSIS — F419 Anxiety disorder, unspecified: Secondary | ICD-10-CM | POA: Diagnosis not present

## 2018-08-26 DIAGNOSIS — R6251 Failure to thrive (child): Secondary | ICD-10-CM | POA: Diagnosis not present

## 2018-08-26 DIAGNOSIS — Z6824 Body mass index (BMI) 24.0-24.9, adult: Secondary | ICD-10-CM | POA: Diagnosis not present

## 2018-09-30 ENCOUNTER — Other Ambulatory Visit: Payer: Self-pay

## 2018-09-30 ENCOUNTER — Ambulatory Visit (INDEPENDENT_AMBULATORY_CARE_PROVIDER_SITE_OTHER): Payer: Medicaid Other | Admitting: *Deleted

## 2018-09-30 DIAGNOSIS — Z3042 Encounter for surveillance of injectable contraceptive: Secondary | ICD-10-CM

## 2018-09-30 MED ORDER — MEDROXYPROGESTERONE ACETATE 150 MG/ML IM SUSP
150.0000 mg | Freq: Once | INTRAMUSCULAR | Status: AC
  Administered 2018-09-30: 150 mg via INTRAMUSCULAR

## 2018-09-30 NOTE — Progress Notes (Signed)
Pt in for depo provera injection. Given IM in left deltoid. Patient tolerated well. Next dose in 12 weeks.

## 2018-12-08 DIAGNOSIS — L818 Other specified disorders of pigmentation: Secondary | ICD-10-CM | POA: Diagnosis not present

## 2018-12-22 ENCOUNTER — Telehealth: Payer: Self-pay | Admitting: Obstetrics and Gynecology

## 2018-12-22 NOTE — Telephone Encounter (Signed)

## 2018-12-23 ENCOUNTER — Other Ambulatory Visit: Payer: Self-pay

## 2018-12-23 ENCOUNTER — Ambulatory Visit (INDEPENDENT_AMBULATORY_CARE_PROVIDER_SITE_OTHER): Payer: Medicaid Other | Admitting: *Deleted

## 2018-12-23 DIAGNOSIS — Z3042 Encounter for surveillance of injectable contraceptive: Secondary | ICD-10-CM

## 2018-12-23 MED ORDER — MEDROXYPROGESTERONE ACETATE 150 MG/ML IM SUSP
150.0000 mg | Freq: Once | INTRAMUSCULAR | Status: AC
  Administered 2018-12-23: 11:00:00 150 mg via INTRAMUSCULAR

## 2018-12-23 NOTE — Progress Notes (Signed)
Depo Provera 150mg IM given in right deltoid with no complications. Pt to return in 12 weeks for next injection.  

## 2019-02-22 ENCOUNTER — Telehealth: Payer: Self-pay | Admitting: Obstetrics & Gynecology

## 2019-02-22 NOTE — Telephone Encounter (Signed)
Called patient regarding appointment scheduled in our office encouraged to come alone to the visit if possible, however, a support person, over age 29, may accompany her  to appointment if assistance is needed for safety or care concerns. Otherwise, support persons should remain outside until the visit is complete.  ° °We ask if you have had any exposure to anyone suspected or confirmed of having COVID-19 or if you are experiencing any of the following, to call and reschedule your appointment: fever, cough, shortness of breath, muscle pain, diarrhea, rash, vomiting, abdominal pain, red eye, weakness, bruising, bleeding, joint pain, or a severe headache.  ° °Please know we will ask you these questions or similar questions when you arrive for your appointment and again it’s how we are keeping everyone safe.   ° °Also,to keep you safe, please use the provided hand sanitizer when you enter the office. We are asking everyone in the office to wear a mask to help prevent the spread of °germs. If you have a mask of your own, please wear it to your appointment, if not, we are happy to provide one for you. ° °Thank you for understanding and your cooperation.  ° ° °[facility] Staff ° ° ° °

## 2019-02-23 ENCOUNTER — Ambulatory Visit: Payer: Medicaid Other

## 2019-02-24 ENCOUNTER — Ambulatory Visit: Payer: Medicaid Other

## 2019-03-09 ENCOUNTER — Telehealth: Payer: Self-pay | Admitting: Obstetrics & Gynecology

## 2019-03-09 NOTE — Telephone Encounter (Signed)

## 2019-03-10 ENCOUNTER — Ambulatory Visit: Payer: Medicaid Other

## 2019-03-16 ENCOUNTER — Telehealth: Payer: Self-pay | Admitting: Obstetrics & Gynecology

## 2019-03-16 NOTE — Telephone Encounter (Signed)

## 2019-03-17 ENCOUNTER — Ambulatory Visit (INDEPENDENT_AMBULATORY_CARE_PROVIDER_SITE_OTHER): Payer: Medicaid Other

## 2019-03-17 ENCOUNTER — Other Ambulatory Visit: Payer: Self-pay

## 2019-03-17 VITALS — Ht 62.0 in | Wt 176.0 lb

## 2019-03-17 DIAGNOSIS — Z3042 Encounter for surveillance of injectable contraceptive: Secondary | ICD-10-CM | POA: Diagnosis not present

## 2019-03-17 MED ORDER — MEDROXYPROGESTERONE ACETATE 150 MG/ML IM SUSP
150.0000 mg | Freq: Once | INTRAMUSCULAR | Status: AC
  Administered 2019-03-17: 150 mg via INTRAMUSCULAR

## 2019-03-17 NOTE — Progress Notes (Signed)
   NURSE VISIT- INJECTION  SUBJECTIVE:  Victoria Mcmahon is a 28 y.o. [phone] female here for a Depo Provera for contraception/period management. She is a GYN patient.   OBJECTIVE:  Ht 5\' 2"  (1.575 m)   Wt 176 lb (79.8 kg)   BMI 32.19 kg/m   Appears well, in no apparent distress  Injection administered in: Left deltoid  Meds ordered this encounter  Medications  . medroxyPROGESTERone (DEPO-PROVERA) injection 150 mg    ASSESSMENT: GYN patient Depo Provera for contraception/period management  PLAN: Follow-up: in 11-13 weeks for next Depo   Ladonna Snide  03/17/2019 11:11 AM

## 2019-06-02 ENCOUNTER — Ambulatory Visit: Payer: Medicaid Other

## 2019-06-09 ENCOUNTER — Other Ambulatory Visit: Payer: Medicaid Other

## 2019-06-10 ENCOUNTER — Ambulatory Visit: Payer: Medicaid Other

## 2019-06-16 ENCOUNTER — Ambulatory Visit: Payer: Medicaid Other

## 2019-06-16 ENCOUNTER — Other Ambulatory Visit: Payer: Self-pay | Admitting: Adult Health

## 2019-06-17 ENCOUNTER — Other Ambulatory Visit: Payer: Self-pay

## 2019-06-17 ENCOUNTER — Ambulatory Visit (INDEPENDENT_AMBULATORY_CARE_PROVIDER_SITE_OTHER): Payer: Medicaid Other | Admitting: *Deleted

## 2019-06-17 DIAGNOSIS — Z3042 Encounter for surveillance of injectable contraceptive: Secondary | ICD-10-CM

## 2019-06-17 MED ORDER — MEDROXYPROGESTERONE ACETATE 150 MG/ML IM SUSP
150.0000 mg | Freq: Once | INTRAMUSCULAR | Status: AC
  Administered 2019-06-17: 10:00:00 150 mg via INTRAMUSCULAR

## 2019-06-17 NOTE — Progress Notes (Signed)
   NURSE VISIT- INJECTION  SUBJECTIVE:  Victoria Mcmahon is a 29 y.o. [phone] female here for a Depo Provera for contraception/period management. She is a GYN patient.   OBJECTIVE:  There were no vitals taken for this visit.  Appears well, in no apparent distress  Injection administered in: Right deltoid  No orders of the defined types were placed in this encounter.   ASSESSMENT: GYN patient Depo Provera for contraception/period management PLAN: Follow-up: in 11-13 weeks for next Depo   Riyan Haile, Celene Squibb  06/17/2019 10:15 AM

## 2019-07-02 ENCOUNTER — Ambulatory Visit: Payer: Medicaid Other | Admitting: Obstetrics and Gynecology

## 2019-07-05 DIAGNOSIS — Z6831 Body mass index (BMI) 31.0-31.9, adult: Secondary | ICD-10-CM | POA: Diagnosis not present

## 2019-07-05 DIAGNOSIS — R140 Abdominal distension (gaseous): Secondary | ICD-10-CM | POA: Diagnosis not present

## 2019-07-05 DIAGNOSIS — E6609 Other obesity due to excess calories: Secondary | ICD-10-CM | POA: Diagnosis not present

## 2019-07-05 DIAGNOSIS — Z1389 Encounter for screening for other disorder: Secondary | ICD-10-CM | POA: Diagnosis not present

## 2019-07-05 DIAGNOSIS — R32 Unspecified urinary incontinence: Secondary | ICD-10-CM | POA: Diagnosis not present

## 2019-07-07 ENCOUNTER — Ambulatory Visit (INDEPENDENT_AMBULATORY_CARE_PROVIDER_SITE_OTHER): Payer: Medicaid Other | Admitting: Family Medicine

## 2019-07-07 ENCOUNTER — Other Ambulatory Visit (HOSPITAL_COMMUNITY)
Admission: RE | Admit: 2019-07-07 | Discharge: 2019-07-07 | Disposition: A | Discharge status: Home / Self Care | Payer: Medicaid Other | Source: Ambulatory Visit | Attending: Obstetrics and Gynecology | Admitting: Obstetrics and Gynecology

## 2019-07-07 ENCOUNTER — Other Ambulatory Visit: Payer: Self-pay

## 2019-07-07 ENCOUNTER — Telehealth: Payer: Self-pay | Admitting: *Deleted

## 2019-07-07 ENCOUNTER — Encounter: Payer: Self-pay | Admitting: Family Medicine

## 2019-07-07 VITALS — BP 120/90 | HR 83 | Ht 62.0 in | Wt 179.0 lb

## 2019-07-07 DIAGNOSIS — N898 Other specified noninflammatory disorders of vagina: Secondary | ICD-10-CM | POA: Diagnosis not present

## 2019-07-07 DIAGNOSIS — Z124 Encounter for screening for malignant neoplasm of cervix: Secondary | ICD-10-CM | POA: Insufficient documentation

## 2019-07-07 DIAGNOSIS — N760 Acute vaginitis: Secondary | ICD-10-CM

## 2019-07-07 DIAGNOSIS — B9689 Other specified bacterial agents as the cause of diseases classified elsewhere: Secondary | ICD-10-CM

## 2019-07-07 LAB — POCT WET PREP (WET MOUNT)

## 2019-07-07 MED ORDER — METRONIDAZOLE 500 MG PO TABS: 500.0000 mg | ORAL_TABLET | Freq: Two times a day (BID) | ORAL | 0 refills | Status: DC

## 2019-07-07 NOTE — Progress Notes (Signed)
GYNECOLOGY OFFICE VISIT NOTE  History:   Victoria Mcmahon is a 29 y.o. [id number] here today for vaginal discharge. Reports that for the last several weeks she has noticed a thin brown discharge with a foul odor. Denies itching. Reports she has had unprotected sex and also desires STD testing. Currently using Depo consistently. Unsure of last Pap. Reports trying different soaps and unsure which she should use. Reports hx of BV. Denies vaginal lesions. Denies fever, chills, nausea, vomiting, SOB, abdominal pain. She denies any abnormal, bleeding, pelvic pain or other concerns.    Past Medical History:  Diagnosis Date  . BV (bacterial vaginosis)   . H/O seasonal allergies   . MRSA (methicillin resistant Staphylococcus aureus)    on her face  . Sickle cell trait ([place])   . Trichimoniasis 04/06/2018    Past Surgical History:  Procedure Laterality Date  . NO PAST SURGERIES      The following portions of the patient's history were reviewed and updated as appropriate: allergies, current medications, past family history, past medical history, past social history, past surgical history and problem list.   Health Maintenance:  Normal pap and negative HRHPV on 05/18/2017.    Review of Systems:  Pertinent items noted in HPI and remainder of comprehensive ROS otherwise negative.  Physical Exam:  BP 120/90 (BP Location: Left Arm, Patient Position: Sitting, Cuff Size: Normal)   Pulse 83   Ht 5\' 2"  (1.575 m)   Wt 179 lb (81.2 kg)   BMI 32.74 kg/m  CONSTITUTIONAL: Well-developed, well-nourished female in no acute distress.  HEENT:  Normocephalic, atraumatic. External right and left ear normal. No scleral icterus.  NECK: Normal range of motion, supple, no masses noted on observation SKIN: No rash noted. Not diaphoretic. No erythema. No pallor. MUSCULOSKELETAL: Normal range of motion. No edema noted. NEUROLOGIC: Alert and oriented to person, place, and time. Normal muscle tone coordination. No  cranial nerve deficit noted. PSYCHIATRIC: Normal mood and affect. Normal behavior. Normal judgment and thought content. CARDIOVASCULAR: Normal heart rate noted RESPIRATORY: Effort normal, no problems with respiration noted ABDOMEN: No masses noted. No other overt distention noted.   PELVIC: Normal appearing external genitalia; normal appearing vaginal mucosa and cervix. Normal uterine size, no other palpable masses, no uterine or adnexal tenderness. Thin white vaginal discharge noted.   Labs and Imaging Results for orders placed or performed in visit on 07/07/19 (from the past 168 hour(s))  POCT Wet Prep [facility] [place])   Collection Time: 07/07/19 10:11 AM  Result Value Ref Range   Source Wet Prep POC     WBC, Wet Prep HPF POC     Bacteria Wet Prep HPF POC     BACTERIA WET PREP MORPHOLOGY POC     Clue Cells Wet Prep HPF POC     Clue Cells Wet Prep [place] POC     Yeast Wet Prep HPF POC     KOH Wet Prep POC     Trichomonas Wet Prep HPF POC     No results found.    Assessment and Plan:  Victoria Mcmahon was seen today for odor/discharge.  Diagnoses and all orders for this visit:  Vaginal discharge -     POCT Wet Prep [facility]) - Clue cells noted on [employer] without other abnormalities and Flagyl sent; GC/Chlamydia testing in progress - Patient declined serum STD testing - Discussed vaginal hygiene   Routine cervical smear -     Cytology - PAP( [place])  -  Last Pap normal in 2018  Bacterial vaginosis -     metroNIDAZOLE (FLAGYL) 500 MG tablet; Take 1 tablet (500 mg total) by mouth 2 (two) times daily.   Routine preventative health maintenance measures emphasized. Please refer to After Visit Summary for other counseling recommendations.   Return if symptoms worsen or fail to improve.    Total face-to-face time with patient: 15 minutes.  Over 50% of encounter was spent on counseling and coordination of care.  Barrington Ellison, MD [facility] Fellow, [facility]  for [employer], [place]

## 2019-07-07 NOTE — Telephone Encounter (Signed)
Patient left message that she was just in our office today but has some questions now.   LMOVM returning patient's call.

## 2019-07-07 NOTE — Telephone Encounter (Signed)
LMOVM returning patient's call.  

## 2019-07-07 NOTE — Patient Instructions (Signed)

## 2019-07-12 LAB — CYTOLOGY - PAP
Chlamydia: NEGATIVE
Comment: NEGATIVE
Comment: NEGATIVE
Comment: NORMAL
Diagnosis: NEGATIVE
High risk HPV: NEGATIVE
Neisseria Gonorrhea: NEGATIVE

## 2019-07-28 DIAGNOSIS — F329 Major depressive disorder, single episode, unspecified: Secondary | ICD-10-CM | POA: Diagnosis not present

## 2019-09-09 ENCOUNTER — Ambulatory Visit (INDEPENDENT_AMBULATORY_CARE_PROVIDER_SITE_OTHER): Payer: Medicaid Other | Admitting: *Deleted

## 2019-09-09 ENCOUNTER — Other Ambulatory Visit: Payer: Self-pay

## 2019-09-09 DIAGNOSIS — Z3042 Encounter for surveillance of injectable contraceptive: Secondary | ICD-10-CM

## 2019-09-09 MED ORDER — MEDROXYPROGESTERONE ACETATE 150 MG/ML IM SUSP
150.0000 mg | Freq: Once | INTRAMUSCULAR | Status: AC
  Administered 2019-09-09: 150 mg via INTRAMUSCULAR

## 2019-09-09 NOTE — Progress Notes (Signed)
   NURSE VISIT- INJECTION  SUBJECTIVE:  Victoria Mcmahon is a 29 y.o. [phone] female here for a Depo Provera for contraception/period management. She is a GYN patient.   OBJECTIVE:  There were no vitals taken for this visit.  Appears well, in no apparent distress  Injection administered in: Left deltoid  No orders of the defined types were placed in this encounter.   ASSESSMENT: GYN patient Depo Provera for contraception/period management PLAN: Follow-up: in 11-13 weeks for next Depo   Victoria Mcmahon  09/09/2019 10:31 AM

## 2019-11-25 ENCOUNTER — Ambulatory Visit: Payer: Medicaid Other

## 2019-12-01 ENCOUNTER — Ambulatory Visit: Payer: Medicaid Other

## 2019-12-08 ENCOUNTER — Ambulatory Visit (INDEPENDENT_AMBULATORY_CARE_PROVIDER_SITE_OTHER): Payer: Medicaid Other | Admitting: *Deleted

## 2019-12-08 DIAGNOSIS — Z3042 Encounter for surveillance of injectable contraceptive: Secondary | ICD-10-CM | POA: Diagnosis not present

## 2019-12-08 MED ORDER — MEDROXYPROGESTERONE ACETATE 150 MG/ML IM SUSP
150.0000 mg | Freq: Once | INTRAMUSCULAR | Status: AC
  Administered 2019-12-08: 150 mg via INTRAMUSCULAR

## 2019-12-08 NOTE — Progress Notes (Signed)
   NURSE VISIT- INJECTION  SUBJECTIVE:  Victoria Mcmahon is a 29 y.o. [phone] female here for a Depo Provera for contraception/period management. She is a GYN patient.   OBJECTIVE:  There were no vitals taken for this visit.  Appears well, in no apparent distress  Injection administered in: Right deltoid  No orders of the defined types were placed in this encounter.   ASSESSMENT: GYN patient Depo Provera for contraception/period management PLAN: Follow-up: in 11-13 weeks for next Depo   Rolena Infante  12/08/2019 11:41 AM

## 2020-03-01 ENCOUNTER — Ambulatory Visit: Payer: Medicaid Other

## 2020-03-14 ENCOUNTER — Other Ambulatory Visit: Payer: Medicaid Other

## 2020-03-14 ENCOUNTER — Ambulatory Visit: Payer: Medicaid Other

## 2020-03-14 DIAGNOSIS — Z3042 Encounter for surveillance of injectable contraceptive: Secondary | ICD-10-CM

## 2020-03-14 LAB — BETA HCG QUANT (REF LAB): hCG Quant: <1 m[IU]/mL

## 2020-04-03 ENCOUNTER — Ambulatory Visit (INDEPENDENT_AMBULATORY_CARE_PROVIDER_SITE_OTHER): Payer: 59 | Admitting: Nurse Practitioner

## 2020-04-03 ENCOUNTER — Other Ambulatory Visit: Payer: Self-pay

## 2020-04-03 ENCOUNTER — Encounter: Payer: Self-pay | Admitting: Nurse Practitioner

## 2020-04-03 VITALS — BP 120/80 | Ht 63.0 in | Wt 190.0 lb

## 2020-04-03 DIAGNOSIS — Z3042 Encounter for surveillance of injectable contraceptive: Secondary | ICD-10-CM | POA: Diagnosis not present

## 2020-04-03 DIAGNOSIS — B9689 Other specified bacterial agents as the cause of diseases classified elsewhere: Secondary | ICD-10-CM | POA: Diagnosis not present

## 2020-04-03 DIAGNOSIS — Z7251 High risk heterosexual behavior: Secondary | ICD-10-CM

## 2020-04-03 DIAGNOSIS — N760 Acute vaginitis: Secondary | ICD-10-CM

## 2020-04-03 DIAGNOSIS — N898 Other specified noninflammatory disorders of vagina: Secondary | ICD-10-CM

## 2020-04-03 DIAGNOSIS — Z113 Encounter for screening for infections with a predominantly sexual mode of transmission: Secondary | ICD-10-CM

## 2020-04-03 LAB — WET PREP FOR TRICH, YEAST, CLUE

## 2020-04-03 LAB — PREGNANCY, URINE: Preg Test, Ur: NEGATIVE

## 2020-04-03 MED ORDER — METRONIDAZOLE 500 MG PO TABS: 500.0000 mg | ORAL_TABLET | Freq: Two times a day (BID) | ORAL | 0 refills | Status: DC

## 2020-04-03 MED ORDER — MEDROXYPROGESTERONE ACETATE 150 MG/ML IM SUSP
150.0000 mg | Freq: Once | INTRAMUSCULAR | Status: AC
  Administered 2020-04-03: 150 mg via INTRAMUSCULAR

## 2020-04-03 NOTE — Progress Notes (Signed)
   Acute Office Visit  Subjective:    Patient ID: Victoria Mcmahon, female    DOB: [birth year], 29 y.o.   MRN: [id number]   HPI 29 y.o. [id number] presents today to establish care. She complains of vaginal odor and discharge.History of trichomonas and BV. Last diagnosis of BV was 07/2019. Has been on Depo Provera and is due now. Her last dose was July 7th, 2021.  Normal pap history with most recent 07/07/2019. Sexually active with one partner. Would like STD screening today but GC/Chlamydia only.    Review of Systems  Constitutional: Negative.   Genitourinary: Positive for vaginal discharge. Negative for dysuria, frequency and urgency.       Vaginal odor       Objective:    Physical Exam Constitutional:      Appearance: Normal appearance.  Genitourinary:    General: Normal vulva.     Vagina: Vaginal discharge present. No erythema or lesions.     Cervix: Normal.     BP 120/80   Ht 5\' 3"  (1.6 m)   Wt 190 lb (86.2 kg)   BMI 33.66 kg/m  Wt Readings from Last 3 Encounters:  04/03/20 190 lb (86.2 kg)  07/07/19 179 lb (81.2 kg)  03/17/19 176 lb (79.8 kg)   Wet prep + clue cells     Assessment & Plan:   Problem List Items Addressed This Visit      Genitourinary   BV (bacterial vaginosis)   Relevant Medications   metroNIDAZOLE (FLAGYL) 500 MG tablet    Other Visit Diagnoses    Vaginal odor    -  Primary   Relevant Orders   WET PREP FOR [name], YEAST, CLUE   Unprotected sexual intercourse       Relevant Orders   Pregnancy, urine   Screen for STD (sexually transmitted disease)       Relevant Orders   C. trachomatis/N. gonorrhoeae RNA     Plan: Wet prep and exam consistent with bacterial vaginosis. Boric acid suppositories for 1 month after completing antibiotic treatment. Continue probiotic. Gonorrhea/chlamydia pending. Will reach out when we receive those. UPT negative. Depo Provera administered today.  She is agreeable to plan.      Tamela Gammon [facility], 12:31  PM 04/03/2020

## 2020-04-03 NOTE — Patient Instructions (Addendum)
Boric Acid vaginal suppositories 3x weekly for 1 month  Bacterial Vaginosis  Bacterial vaginosis is a vaginal infection that occurs when the normal balance of bacteria in the vagina is disrupted. It results from an overgrowth of certain bacteria. This is the most common vaginal infection among women ages 37-44. Because bacterial vaginosis increases your risk for STIs (sexually transmitted infections), getting treated can help reduce your risk for chlamydia, gonorrhea, herpes, and HIV (human immunodeficiency virus). Treatment is also important for preventing complications in pregnant women, because this condition can cause an early (premature) delivery. What are the causes? This condition is caused by an increase in harmful bacteria that are normally present in small amounts in the vagina. However, the reason that the condition develops is not fully understood. What increases the risk? The following factors may make you more likely to develop this condition:  Having a new sexual partner or multiple sexual partners.  Having unprotected sex.  Douching.  Having an intrauterine device (IUD).  Smoking.  Drug and alcohol abuse.  Taking certain antibiotic medicines.  Being pregnant. You cannot get bacterial vaginosis from toilet seats, bedding, swimming pools, or contact with objects around you. What are the signs or symptoms? Symptoms of this condition include:  Grey or white vaginal discharge. The discharge can also be watery or foamy.  A fish-like odor with discharge, especially after sexual intercourse or during menstruation.  Itching in and around the vagina.  Burning or pain with urination. Some women with bacterial vaginosis have no signs or symptoms. How is this diagnosed? This condition is diagnosed based on:  Your medical history.  A physical exam of the vagina.  Testing a sample of vaginal fluid under a microscope to look for a large amount of bad bacteria or abnormal  cells. Your health care provider may use a cotton swab or a small wooden spatula to collect the sample. How is this treated? This condition is treated with antibiotics. These may be given as a pill, a vaginal cream, or a medicine that is put into the vagina (suppository). If the condition comes back after treatment, a second round of antibiotics may be needed. Follow these instructions at home: Medicines  Take over-the-counter and prescription medicines only as told by your health care provider.  Take or use your antibiotic as told by your health care provider. Do not stop taking or using the antibiotic even if you start to feel better. General instructions  If you have a female sexual partner, tell her that you have a vaginal infection. She should see her health care provider and be treated if she has symptoms. If you have a female sexual partner, he does not need treatment.  During treatment: ? Avoid sexual activity until you finish treatment. ? Do not douche. ? Avoid alcohol as directed by your health care provider. ? Avoid breastfeeding as directed by your health care provider.  Drink enough water and fluids to keep your urine clear or pale yellow.  Keep the area around your vagina and rectum clean. ? Wash the area daily with warm water. ? Wipe yourself from front to back after using the toilet.  Keep all follow-up visits as told by your health care provider. This is important. How is this prevented?  Do not douche.  Wash the outside of your vagina with warm water only.  Use protection when having sex. This includes latex condoms and dental dams.  Limit how many sexual partners you have. To help prevent bacterial vaginosis,  it is best to have sex with just one partner (monogamous).  Make sure you and your sexual partner are tested for STIs.  Wear cotton or cotton-lined underwear.  Avoid wearing tight pants and pantyhose, especially during summer.  Limit the amount of  alcohol that you drink.  Do not use any products that contain nicotine or tobacco, such as cigarettes and e-cigarettes. If you need help quitting, ask your health care provider.  Do not use illegal drugs. Where to find more information  Centers for Disease Control and Prevention: AppraiserFraud.fi  American Sexual Health Association (ASHA): www.ashastd.org  U.S. Department of Health and Financial controller, Office on Women's Health: DustingSprays.pl or SecuritiesCard.it Contact a health care provider if:  Your symptoms do not improve, even after treatment.  You have more discharge or pain when urinating.  You have a fever.  You have pain in your abdomen.  You have pain during sex.  You have vaginal bleeding between periods. Summary  Bacterial vaginosis is a vaginal infection that occurs when the normal balance of bacteria in the vagina is disrupted.  Because bacterial vaginosis increases your risk for STIs (sexually transmitted infections), getting treated can help reduce your risk for chlamydia, gonorrhea, herpes, and HIV (human immunodeficiency virus). Treatment is also important for preventing complications in pregnant women, because the condition can cause an early (premature) delivery.  This condition is treated with antibiotic medicines. These may be given as a pill, a vaginal cream, or a medicine that is put into the vagina (suppository). This information is not intended to replace advice given to you by your health care provider. Make sure you discuss any questions you have with your health care provider. Document Revised: 05/02/2017 Document Reviewed: 02/03/2016 Elsevier Patient Education  2020 [employer].

## 2020-04-05 LAB — C. TRACHOMATIS/N. GONORRHOEAE RNA
C. trachomatis RNA, TMA: NOT DETECTED
N. gonorrhoeae RNA, TMA: NOT DETECTED

## 2020-06-21 ENCOUNTER — Ambulatory Visit: Payer: 59

## 2020-07-05 ENCOUNTER — Telehealth: Payer: Self-pay | Admitting: *Deleted

## 2020-07-05 MED ORDER — MEDROXYPROGESTERONE ACETATE 150 MG/ML IM SUSP: 150.0000 mg | Freq: Once | INTRAMUSCULAR | 0 refills | Status: DC

## 2020-07-05 NOTE — Telephone Encounter (Signed)
Patient called requesting refill on depo provera sent to [facility] on [address]. Per recall patient is due for annual exam in Feb 2022. I explained this to patient has well and told her she will need to schedule annual exam this month. Patient verbalized she understood,patient reports she is not sexually active. Last depo was 04/03/20 next depo was due between 06/20/19-07/04/19. Patient will cal to annual exam/depo shot. Rx sent.

## 2020-07-12 ENCOUNTER — Other Ambulatory Visit: Payer: Self-pay

## 2020-07-12 ENCOUNTER — Encounter: Payer: Self-pay | Admitting: Nurse Practitioner

## 2020-07-12 ENCOUNTER — Ambulatory Visit (INDEPENDENT_AMBULATORY_CARE_PROVIDER_SITE_OTHER): Payer: Medicaid Other | Admitting: Nurse Practitioner

## 2020-07-12 VITALS — BP 116/70 | Ht 62.5 in | Wt 191.0 lb

## 2020-07-12 DIAGNOSIS — Z3042 Encounter for surveillance of injectable contraceptive: Secondary | ICD-10-CM | POA: Diagnosis not present

## 2020-07-12 DIAGNOSIS — N912 Amenorrhea, unspecified: Secondary | ICD-10-CM

## 2020-07-12 DIAGNOSIS — Z01419 Encounter for gynecological examination (general) (routine) without abnormal findings: Secondary | ICD-10-CM | POA: Diagnosis not present

## 2020-07-12 LAB — PREGNANCY, URINE: Preg Test, Ur: NEGATIVE

## 2020-07-12 MED ORDER — MEDROXYPROGESTERONE ACETATE 150 MG/ML IM SUSP
150.0000 mg | Freq: Once | INTRAMUSCULAR | Status: AC
  Administered 2020-07-12: 150 mg via INTRAMUSCULAR

## 2020-07-12 NOTE — Patient Instructions (Signed)
Health Maintenance, Female Adopting a healthy lifestyle and getting preventive care are important in promoting health and wellness. Ask your health care provider about:  The right schedule for you to have regular tests and exams.  Things you can do on your own to prevent diseases and keep yourself healthy. What should I know about diet, weight, and exercise? Eat a healthy diet  Eat a diet that includes plenty of vegetables, fruits, low-fat dairy products, and lean protein.  Do not eat a lot of foods that are high in solid fats, added sugars, or sodium.   Maintain a healthy weight Body mass index (BMI) is used to identify weight problems. It estimates body fat based on height and weight. Your health care provider can help determine your BMI and help you achieve or maintain a healthy weight. Get regular exercise Get regular exercise. This is one of the most important things you can do for your health. Most adults should:  Exercise for at least 150 minutes each week. The exercise should increase your heart rate and make you sweat (moderate-intensity exercise).  Do strengthening exercises at least twice a week. This is in addition to the moderate-intensity exercise.  Spend less time sitting. Even light physical activity can be beneficial. Watch cholesterol and blood lipids Have your blood tested for lipids and cholesterol at 31 years of age, then have this test every 5 years. Have your cholesterol levels checked more often if:  Your lipid or cholesterol levels are high.  You are older than 31 years of age.  You are at high risk for heart disease. What should I know about cancer screening? Depending on your health history and family history, you may need to have cancer screening at various ages. This may include screening for:  Breast cancer.  Cervical cancer.  Colorectal cancer.  Skin cancer.  Lung cancer. What should I know about heart disease, diabetes, and high blood  pressure? Blood pressure and heart disease  High blood pressure causes heart disease and increases the risk of stroke. This is more likely to develop in people who have high blood pressure readings, are of African descent, or are overweight.  Have your blood pressure checked: ? Every 3-5 years if you are 18-39 years of age. ? Every year if you are 40 years old or older. Diabetes Have regular diabetes screenings. This checks your fasting blood sugar level. Have the screening done:  Once every three years after age 40 if you are at a normal weight and have a low risk for diabetes.  More often and at a younger age if you are overweight or have a high risk for diabetes. What should I know about preventing infection? Hepatitis B If you have a higher risk for hepatitis B, you should be screened for this virus. Talk with your health care provider to find out if you are at risk for hepatitis B infection. Hepatitis C Testing is recommended for:  Everyone born from 1945 through 1965.  Anyone with known risk factors for hepatitis C. Sexually transmitted infections (STIs)  Get screened for STIs, including gonorrhea and chlamydia, if: ? You are sexually active and are younger than 31 years of age. ? You are older than 31 years of age and your health care provider tells you that you are at risk for this type of infection. ? Your sexual activity has changed since you were last screened, and you are at increased risk for chlamydia or gonorrhea. Ask your health care provider   if you are at risk.  Ask your health care provider about whether you are at high risk for HIV. Your health care provider may recommend a prescription medicine to help prevent HIV infection. If you choose to take medicine to prevent HIV, you should first get tested for HIV. You should then be tested every 3 months for as long as you are taking the medicine. Pregnancy  If you are about to stop having your period (premenopausal) and  you may become pregnant, seek counseling before you get pregnant.  Take 400 to 800 micrograms (mcg) of folic acid every day if you become pregnant.  Ask for birth control (contraception) if you want to prevent pregnancy. Osteoporosis and menopause Osteoporosis is a disease in which the bones lose minerals and strength with aging. This can result in bone fractures. If you are 65 years old or older, or if you are at risk for osteoporosis and fractures, ask your health care provider if you should:  Be screened for bone loss.  Take a calcium or vitamin D supplement to lower your risk of fractures.  Be given hormone replacement therapy (HRT) to treat symptoms of menopause. Follow these instructions at home: Lifestyle  Do not use any products that contain nicotine or tobacco, such as cigarettes, e-cigarettes, and chewing tobacco. If you need help quitting, ask your health care provider.  Do not use street drugs.  Do not share needles.  Ask your health care provider for help if you need support or information about quitting drugs. Alcohol use  Do not drink alcohol if: ? Your health care provider tells you not to drink. ? You are pregnant, may be pregnant, or are planning to become pregnant.  If you drink alcohol: ? Limit how much you use to 0-1 drink a day. ? Limit intake if you are breastfeeding.  Be aware of how much alcohol is in your drink. In the [place] one drink equals one 12 oz bottle of beer (355 mL), one 5 oz glass of wine (148 mL), or one 1 oz glass of hard liquor (44 mL). General instructions  Schedule regular health, dental, and eye exams.  Stay current with your vaccines.  Tell your health care provider if: ? You often feel depressed. ? You have ever been abused or do not feel safe at home. Summary  Adopting a healthy lifestyle and getting preventive care are important in promoting health and wellness.  Follow your health care provider's instructions about healthy  diet, exercising, and getting tested or screened for diseases.  Follow your health care provider's instructions on monitoring your cholesterol and blood pressure. This information is not intended to replace advice given to you by your health care provider. Make sure you discuss any questions you have with your health care provider. Document Revised: 05/13/2018 Document Reviewed: 05/13/2018 Elsevier Patient Education  2021 Elsevier Inc.  

## 2020-07-12 NOTE — Progress Notes (Signed)
   Victoria Mcmahon 05/31/1991 [id number]   History:  30 y.o. [id number] presents for annual exam without GYN complaints.  Amenorrhiec on Depo Provera and due now (9 days overdue). Normal pap history. She has started using boric acid vaginal suppositories due to history of BV.   Gynecologic History No LMP recorded. Patient has had an injection.   Contraception/Family planning: Depo-Provera injections  Health Maintenance Last Pap: 07/07/2019. Results were: normal Last mammogram: N/A Last colonoscopy: N/A Last Dexa: N/A  Past medical history, past surgical history, family history and social history were all reviewed and documented in the EPIC chart.  ROS:  A ROS was performed and pertinent positives and negatives are included.  Exam:  Vitals:   07/12/20 1442  BP: 116/70  Weight: 191 lb (86.6 kg)  Height: 5' 2.5" (1.588 m)   Body mass index is 34.38 kg/m.  General appearance:  Normal Thyroid:  Symmetrical, normal in size, without palpable masses or nodularity. Respiratory  Auscultation:  Clear without wheezing or rhonchi Cardiovascular  Auscultation:  Regular rate, without rubs, murmurs or gallops  Edema/varicosities:  Not grossly evident Abdominal  Soft,nontender, without masses, guarding or rebound.  Liver/spleen:  No organomegaly noted  Hernia:  None appreciated  Skin  Inspection:  Grossly normal   Breasts: Examined lying and sitting.   Right: Without masses, retractions, discharge or axillary adenopathy.   Left: Without masses, retractions, discharge or axillary adenopathy. Gentitourinary   Inguinal/mons:  Normal without inguinal adenopathy  External genitalia:  Normal  BUS/Urethra/Skene's glands:  Normal  Vagina:  Normal  Cervix:  Normal  Uterus:  Normal in size, shape and contour.  Midline and mobile  Adnexa/parametria:     Rt: Without masses or tenderness.   Lt: Without masses or tenderness.  Anus and perineum: Normal  Assessment/Plan:  30 y.o. [id number] for  annual exam.   Well female exam with routine gynecological exam - Education provided on SBEs, importance of preventative screenings, current guidelines, high calcium diet, regular exercise, and multivitamin daily.  Encounter for surveillance of injectable contraceptive -Plan: medroxyPROGESTERone (DEPO-PROVERA) injection 150 mg. Overdue - UPT negative.   Amenorrhea - Plan: Pregnancy, urine  Screening for cervical cancer - Normal pap history. Will repeat at 3-year interval per guidelines.  Follow up in 1 year for annual.       Tamela Gammon [facility], 3:17 PM 07/12/2020

## 2020-09-25 ENCOUNTER — Ambulatory Visit: Payer: Medicaid Other

## 2020-09-27 ENCOUNTER — Ambulatory Visit: Payer: Medicaid Other

## 2020-10-27 ENCOUNTER — Other Ambulatory Visit: Payer: Self-pay

## 2020-10-27 ENCOUNTER — Ambulatory Visit (INDEPENDENT_AMBULATORY_CARE_PROVIDER_SITE_OTHER): Payer: Medicaid Other | Admitting: *Deleted

## 2020-10-27 DIAGNOSIS — Z3042 Encounter for surveillance of injectable contraceptive: Secondary | ICD-10-CM | POA: Diagnosis not present

## 2020-10-27 MED ORDER — MEDROXYPROGESTERONE ACETATE 150 MG/ML IM SUSP
150.0000 mg | Freq: Once | INTRAMUSCULAR | Status: AC
  Administered 2020-10-27: 150 mg via INTRAMUSCULAR

## 2020-10-27 NOTE — Progress Notes (Signed)
Date last pap: 07/07/19, Negative; Last AEX 07/12/20 Last Depo-Provera: 07/12/20 RUOQ. Side Effects if any: N/A Depo-Provera 150 mg IM given by: Kimalexis Next appointment due: 8/12-8/26

## 2021-01-05 ENCOUNTER — Ambulatory Visit: Payer: Medicaid Other | Admitting: Obstetrics & Gynecology

## 2021-01-05 DIAGNOSIS — Z0289 Encounter for other administrative examinations: Secondary | ICD-10-CM

## 2021-01-12 ENCOUNTER — Ambulatory Visit: Payer: Medicaid Other

## 2021-01-22 ENCOUNTER — Ambulatory Visit: Payer: Medicaid Other | Admitting: Nurse Practitioner

## 2021-01-22 DIAGNOSIS — Z0289 Encounter for other administrative examinations: Secondary | ICD-10-CM

## 2021-02-12 ENCOUNTER — Ambulatory Visit (INDEPENDENT_AMBULATORY_CARE_PROVIDER_SITE_OTHER): Payer: Medicaid Other | Admitting: Nurse Practitioner

## 2021-02-12 ENCOUNTER — Other Ambulatory Visit: Payer: Self-pay

## 2021-02-12 VITALS — BP 120/78 | Wt 176.0 lb

## 2021-02-12 DIAGNOSIS — Z113 Encounter for screening for infections with a predominantly sexual mode of transmission: Secondary | ICD-10-CM

## 2021-02-12 DIAGNOSIS — B373 Candidiasis of vulva and vagina: Secondary | ICD-10-CM

## 2021-02-12 DIAGNOSIS — N898 Other specified noninflammatory disorders of vagina: Secondary | ICD-10-CM

## 2021-02-12 DIAGNOSIS — B3731 Acute candidiasis of vulva and vagina: Secondary | ICD-10-CM

## 2021-02-12 LAB — WET PREP FOR TRICH, YEAST, CLUE

## 2021-02-12 MED ORDER — FLUCONAZOLE 150 MG PO TABS: 150.0000 mg | ORAL_TABLET | ORAL | 0 refills | Status: DC

## 2021-02-12 NOTE — Progress Notes (Signed)
   Acute Office Visit  Subjective:    Patient ID: Victoria Mcmahon, female    DOB: 20-Oct-1990, 30 y.o.   MRN: [id number]   HPI 30 y.o. presents today for STD screening. She does complain of vaginal discharge. She is also requesting Depo shot. Last dose 10/27/2020.    Review of Systems  Constitutional: Negative.   Genitourinary:  Positive for vaginal discharge.      Objective:    Physical Exam Constitutional:      Appearance: Normal appearance.  Genitourinary:    General: Normal vulva.     Vagina: Vaginal discharge and erythema present.     Cervix: Erythema present.    BP 120/78   Wt 176 lb (79.8 kg)   BMI 31.68 kg/m  Wt Readings from Last 3 Encounters:  02/12/21 176 lb (79.8 kg)  07/12/20 191 lb (86.6 kg)  04/03/20 190 lb (86.2 kg)   Wet prep + yeast     Assessment & Plan:   Problem List Items Addressed This Visit   None Visit Diagnoses     Screen for STD (sexually transmitted disease)    -  Primary   Relevant Orders   RPR   SURESWAB CT/NG/T. vaginalis   HIV Antibody (routine testing w rflx)   Vaginal discharge       Relevant Orders   WET PREP FOR [place], YEAST, CLUE   Vaginal candidiasis       Relevant Medications   fluconazole (DIFLUCAN) 150 MG tablet       Plan: Wet prep positive for yeast - Diflucan 150 mg today and repeat in 3 days for total of 2 doses. STD panel pending. She is aware she is overdue for Depo and has been sexually active. She will abstain for 2 weeks and return at that time for UPT and Depo shot. She is agreeable to plan.     [place], 10:16 AM 02/12/2021

## 2021-02-13 ENCOUNTER — Other Ambulatory Visit: Payer: Self-pay | Admitting: Nurse Practitioner

## 2021-02-13 DIAGNOSIS — A599 Trichomoniasis, unspecified: Secondary | ICD-10-CM

## 2021-02-13 LAB — SURESWAB CT/NG/T. VAGINALIS
C. trachomatis RNA, TMA: NOT DETECTED
N. gonorrhoeae RNA, TMA: NOT DETECTED
Trichomonas vaginalis RNA: DETECTED — AB

## 2021-02-13 MED ORDER — METRONIDAZOLE 500 MG PO TABS: 500.0000 mg | ORAL_TABLET | Freq: Two times a day (BID) | ORAL | 0 refills | Status: DC

## 2021-02-26 ENCOUNTER — Ambulatory Visit (INDEPENDENT_AMBULATORY_CARE_PROVIDER_SITE_OTHER): Payer: Medicaid Other

## 2021-02-26 ENCOUNTER — Other Ambulatory Visit: Payer: Self-pay

## 2021-02-26 DIAGNOSIS — Z3042 Encounter for surveillance of injectable contraceptive: Secondary | ICD-10-CM | POA: Diagnosis not present

## 2021-02-26 LAB — PREGNANCY, URINE: Preg Test, Ur: NEGATIVE

## 2021-02-26 MED ORDER — MEDROXYPROGESTERONE ACETATE 150 MG/ML IM SUSP
150.0000 mg | Freq: Once | INTRAMUSCULAR | Status: AC
  Administered 2021-02-26: 150 mg via INTRAMUSCULAR

## 2021-02-26 NOTE — Progress Notes (Signed)
Patient is here for Depo Provera Injection Patient is within Depo Provera Calender Limits no.  Provider aware patient is late for injection. Per note of 02-12-21, patient to abstain from intercourse x2 weeks prior to Depo injection 05-28-21 and will have UPT at appointment.  IF neg can have Depo injection. Next Depo Due between: 05/14/21 - 05/28/21 Last AEX: 07-12-20 AEX Scheduled: not scheduled  Patient is aware when next depo is due.  Pt tolerated Injection well in LUOQ.  Routed to provider for review, encounter closed.

## 2021-05-14 ENCOUNTER — Ambulatory Visit: Payer: Medicaid Other

## 2021-05-22 ENCOUNTER — Ambulatory Visit (INDEPENDENT_AMBULATORY_CARE_PROVIDER_SITE_OTHER): Payer: Medicaid Other | Admitting: Gynecology

## 2021-05-22 ENCOUNTER — Other Ambulatory Visit: Payer: Self-pay

## 2021-05-22 DIAGNOSIS — Z3042 Encounter for surveillance of injectable contraceptive: Secondary | ICD-10-CM

## 2021-05-22 MED ORDER — MEDROXYPROGESTERONE ACETATE 150 MG/ML IM SUSP
150.0000 mg | Freq: Once | INTRAMUSCULAR | Status: AC
  Administered 2021-05-22: 13:00:00 150 mg via INTRAMUSCULAR

## 2021-08-10 ENCOUNTER — Ambulatory Visit: Payer: Medicaid Other

## 2021-08-14 ENCOUNTER — Other Ambulatory Visit: Payer: Self-pay

## 2021-08-14 ENCOUNTER — Ambulatory Visit (INDEPENDENT_AMBULATORY_CARE_PROVIDER_SITE_OTHER): Payer: Medicaid Other

## 2021-08-14 DIAGNOSIS — Z3042 Encounter for surveillance of injectable contraceptive: Secondary | ICD-10-CM

## 2021-08-14 MED ORDER — MEDROXYPROGESTERONE ACETATE 150 MG/ML IM SUSP
150.0000 mg | Freq: Once | INTRAMUSCULAR | Status: AC
  Administered 2021-08-14: 150 mg via INTRAMUSCULAR

## 2021-08-14 NOTE — Progress Notes (Signed)
Patient is here for Depo Provera Injection ?Patient is within Depo Provera Calender Limits yes last given 05/22/21  ?Next Depo Due between: May 30-Jun 13  ?Last AEX: 07/12/20  ?AEX Scheduled: none scheduled . She is over due and can not have any more Depo until she has an annual exam.  ? ?Patient is aware when next depo is due ? ?Pt tolerated Injection well. ? ?Routed to provider for review, encounter closed. ? ?

## 2021-09-04 ENCOUNTER — Ambulatory Visit: Payer: Medicaid Other | Admitting: Nurse Practitioner

## 2021-09-04 DIAGNOSIS — Z0289 Encounter for other administrative examinations: Secondary | ICD-10-CM

## 2021-09-04 NOTE — Progress Notes (Deleted)
? ?  Victoria Mcmahon 09-23-1990 [id number] ? ? ?History:  31 y.o. [id number] presents for annual exam. Amenorrheic on Depo Provera.  Normal pap history. She has started using boric acid vaginal suppositories due to history of BV.  ? ?Gynecologic History ?No LMP recorded. Patient has had an injection. ?  ?Contraception/Family planning: Depo-Provera injections ?Sexually active: *** ? ?Health Maintenance ?Last Pap: 07/07/2019. Results were: normal ?Last mammogram: Not indicated ?Last colonoscopy: Not indicated ?Last Dexa: Not indicated ? ?Past medical history, past surgical history, family history and social history were all reviewed and documented in the EPIC chart. Works for [employer].  ? ?ROS:  A ROS was performed and pertinent positives and negatives are included. ? ?Exam: ? ?There were no vitals filed for this visit. ? ?There is no height or weight on file to calculate BMI. ? ?General appearance:  Normal ?Thyroid:  Symmetrical, normal in size, without palpable masses or nodularity. ?Respiratory ? Auscultation:  Clear without wheezing or rhonchi ?Cardiovascular ? Auscultation:  Regular rate, without rubs, murmurs or gallops ? Edema/varicosities:  Not grossly evident ?Abdominal ? Soft,nontender, without masses, guarding or rebound. ? Liver/spleen:  No organomegaly noted ? Hernia:  None appreciated ? Skin ? Inspection:  Grossly normal ?  ?Breasts: Examined lying and sitting.  ? Right: Without masses, retractions, discharge or axillary adenopathy. ? ? Left: Without masses, retractions, discharge or axillary adenopathy. ?Genitourinary  ? Inguinal/mons:  Normal without inguinal adenopathy ? External genitalia:  Normal appearing vulva with no masses, tenderness, or lesions ? BUS/Urethra/Skene's glands:  Normal ? Vagina:  Normal appearing with normal color and discharge, no lesions ? Cervix:  Normal appearing without discharge or lesions ? Uterus:  Normal in size, shape and contour.  Midline and mobile, nontender ? Adnexa/parametria:    ?  Rt: Normal in size, without masses or tenderness. ?  Lt: Normal in size, without masses or tenderness. ? Anus and perineum: Normal ? Digital rectal exam: Not indicated ? ?Patient informed chaperone available to be present for breast and pelvic exam. Patient has requested no chaperone to be present. Patient has been advised what will be completed during breast and pelvic exam.  ? ?Assessment/Plan:  31 y.o. [id number] for annual exam.  ? ? ?Screening for cervical cancer - Normal pap history. Will repeat at 3-year interval per guidelines. ? ?Follow up in 1 year for annual.  ? ? ? ? ? ?[place], 8:36 AM 09/04/2021 ? ?

## 2022-01-28 NOTE — Progress Notes (Deleted)
   Victoria Mcmahon 1990-08-14 [id number]   History:  31 y.o. [id number] presents for annual exam without GYN complaints.  Amenorrhiec on Depo Provera. Normal pap history. Using boric acid suppositories for recurrent BV. + trich 02/2021.   Gynecologic History No LMP recorded. Patient has had an injection.   Contraception/Family planning: Depo-Provera injections Sexually active: ***  Health Maintenance Last Pap: 07/07/2019. Results were: Normal, 5-year repeat Last mammogram: Not indicated Last colonoscopy: Not indicated Last Dexa: Not indicated  Past medical history, past surgical history, family history and social history were all reviewed and documented in the EPIC chart.  ROS:  A ROS was performed and pertinent positives and negatives are included.  Exam:  There were no vitals filed for this visit.  There is no height or weight on file to calculate BMI.  General appearance:  Normal Thyroid:  Symmetrical, normal in size, without palpable masses or nodularity. Respiratory  Auscultation:  Clear without wheezing or rhonchi Cardiovascular  Auscultation:  Regular rate, without rubs, murmurs or gallops  Edema/varicosities:  Not grossly evident Abdominal  Soft,nontender, without masses, guarding or rebound.  Liver/spleen:  No organomegaly noted  Hernia:  None appreciated  Skin  Inspection:  Grossly normal Breasts: Examined lying and sitting.   Right: Without masses, retractions, nipple discharge or axillary adenopathy.   Left: Without masses, retractions, nipple discharge or axillary adenopathy. Genitourinary   Inguinal/mons:  Normal without inguinal adenopathy  External genitalia:  Normal appearing vulva with no masses, tenderness, or lesions  BUS/Urethra/Skene's glands:  Normal  Vagina:  Normal appearing with normal color and discharge, no lesions  Cervix:  Normal appearing without discharge or lesions  Uterus:  Normal in size, shape and contour.  Midline and mobile,  nontender  Adnexa/parametria:     Rt: Normal in size, without masses or tenderness.   Lt: Normal in size, without masses or tenderness.  Anus and perineum: Normal  Digital rectal exam: Not indicated  Patient informed chaperone available to be present for breast and pelvic exam. Patient has requested no chaperone to be present. Patient has been advised what will be completed during breast and pelvic exam.   Assessment/Plan:  31 y.o. [id number] for annual exam.   Well female exam with routine gynecological exam - Education provided on SBEs, importance of preventative screenings, current guidelines, high calcium diet, regular exercise, and multivitamin daily.  Encounter for surveillance of injectable contraceptive  Screening for cervical cancer - Normal pap history. Will repeat at 5-year interval per guidelines.  Follow up in 1 year for annual.       Tamela Gammon [facility], 12:39 PM 01/28/2022

## 2022-01-29 ENCOUNTER — Ambulatory Visit: Payer: Medicaid Other | Admitting: Nurse Practitioner

## 2022-01-29 DIAGNOSIS — Z3042 Encounter for surveillance of injectable contraceptive: Secondary | ICD-10-CM

## 2022-01-29 DIAGNOSIS — Z01419 Encounter for gynecological examination (general) (routine) without abnormal findings: Secondary | ICD-10-CM

## 2022-01-30 ENCOUNTER — Encounter: Payer: Self-pay | Admitting: Nurse Practitioner

## 2022-01-30 ENCOUNTER — Ambulatory Visit (INDEPENDENT_AMBULATORY_CARE_PROVIDER_SITE_OTHER): Payer: Medicaid Other | Admitting: Nurse Practitioner

## 2022-01-30 VITALS — BP 102/62 | HR 90 | Ht 62.5 in | Wt 184.0 lb

## 2022-01-30 DIAGNOSIS — Z3042 Encounter for surveillance of injectable contraceptive: Secondary | ICD-10-CM

## 2022-01-30 DIAGNOSIS — N760 Acute vaginitis: Secondary | ICD-10-CM

## 2022-01-30 DIAGNOSIS — Z01419 Encounter for gynecological examination (general) (routine) without abnormal findings: Secondary | ICD-10-CM | POA: Diagnosis not present

## 2022-01-30 DIAGNOSIS — N898 Other specified noninflammatory disorders of vagina: Secondary | ICD-10-CM

## 2022-01-30 DIAGNOSIS — Z113 Encounter for screening for infections with a predominantly sexual mode of transmission: Secondary | ICD-10-CM

## 2022-01-30 DIAGNOSIS — B9689 Other specified bacterial agents as the cause of diseases classified elsewhere: Secondary | ICD-10-CM

## 2022-01-30 DIAGNOSIS — R3915 Urgency of urination: Secondary | ICD-10-CM | POA: Diagnosis not present

## 2022-01-30 LAB — URINALYSIS W MICROSCOPIC + REFLEX CULTURE
Bacteria, UA: NONE SEEN /HPF
Bilirubin Urine: NEGATIVE
Casts: NONE SEEN /LPF
Crystals: NONE SEEN /HPF
Glucose, UA: NEGATIVE
Hgb urine dipstick: NEGATIVE
Hyaline Cast: NONE SEEN /LPF
Leukocyte Esterase: NEGATIVE
Nitrites, Initial: NEGATIVE
RBC / HPF: NONE SEEN /HPF (ref 0–2)
Specific Gravity, Urine: 1.02 (ref 1.001–1.035)
WBC, UA: NONE SEEN /HPF (ref 0–5)
Yeast: NONE SEEN /HPF
pH: 7.5 (ref 5.0–8.0)

## 2022-01-30 LAB — WET PREP FOR TRICH, YEAST, CLUE

## 2022-01-30 LAB — NO CULTURE INDICATED

## 2022-01-30 LAB — PREGNANCY, URINE: Preg Test, Ur: NEGATIVE

## 2022-01-30 MED ORDER — MEDROXYPROGESTERONE ACETATE 150 MG/ML IM SUSP
150.0000 mg | Freq: Once | INTRAMUSCULAR | Status: AC
  Administered 2022-01-30: 150 mg via INTRAMUSCULAR

## 2022-01-30 MED ORDER — METRONIDAZOLE 0.75 % VA GEL: 1.0000 | Freq: Every day | VAGINAL | 0 refills | Status: AC

## 2022-01-30 NOTE — Progress Notes (Signed)
Victoria Mcmahon 1990-10-30 [id number]   History:  31 y.o. [id number] presents for annual exam. Wants to continue Depo. Last injection March 2023. Amenorrheic since. Reports no sexual activity for a few weeks. Normal pap history. Reports recurrent BV. Not using anything OTC for management. Used Boric acid suppositories in the past. + trich 02/2021. Has had mild urinary urgency without dysuria, frequency, or hematuria.   Gynecologic History No LMP recorded. Patient has had an injection.   Contraception/Family planning: Depo-Provera injections Sexually active: Yes  Health Maintenance Last Pap: 07/07/2019. Results were: Normal, 5-year repeat Last mammogram: Not indicated Last colonoscopy: Not indicated Last Dexa: Not indicated  Past medical history, past surgical history, family history and social history were all reviewed and documented in the EPIC chart. Works for [employer]. 32 yo daughter, 32 yo son.   ROS:  A ROS was performed and pertinent positives and negatives are included.  Exam:  Vitals:   01/30/22 1205  BP: 102/62  Pulse: 90  SpO2: 98%  Weight: 184 lb (83.5 kg)  Height: 5' 2.5" (1.588 m)    Body mass index is 33.12 kg/m.  General appearance:  Normal Thyroid:  Symmetrical, normal in size, without palpable masses or nodularity. Respiratory  Auscultation:  Clear without wheezing or rhonchi Cardiovascular  Auscultation:  Regular rate, without rubs, murmurs or gallops  Edema/varicosities:  Not grossly evident Abdominal  Soft,nontender, without masses, guarding or rebound.  Liver/spleen:  No organomegaly noted  Hernia:  None appreciated  Skin  Inspection:  Grossly normal Breasts: Examined lying and sitting.   Right: Without masses, retractions, nipple discharge or axillary adenopathy.   Left: Without masses, retractions, nipple discharge or axillary adenopathy. Genitourinary   Inguinal/mons:  Normal without inguinal adenopathy  External genitalia:  Normal appearing vulva  with no masses, tenderness, or lesions  BUS/Urethra/Skene's glands:  Normal  Vagina:  Normal appearing with normal color and discharge, no lesions  Cervix:  Normal appearing without discharge or lesions  Uterus:  Normal in size, shape and contour.  Midline and mobile, nontender  Adnexa/parametria:     Rt: Normal in size, without masses or tenderness.   Lt: Normal in size, without masses or tenderness.  Anus and perineum: Normal  Digital rectal exam: Not indicated  Patient informed chaperone available to be present for breast and pelvic exam. Patient has requested no chaperone to be present. Patient has been advised what will be completed during breast and pelvic exam.   Wet prep + clue cells (+ odor) UA negative UPT negative  Assessment/Plan:  31 y.o. [id number] for annual exam.   Well female exam with routine gynecological exam - Education provided on SBEs, importance of preventative screenings, current guidelines, high calcium diet, regular exercise, and multivitamin daily.  Surveillance of contraceptive injection - Plan: Pregnancy, urine, medroxyPROGESTERone (DEPO-PROVERA) injection 150 mg every 90 days. Last dose March 2023, amenorrheic since. Not sexual activity for a few weeks. UPT negative.   Bacterial vaginosis - Plan: metroNIDAZOLE (METROGEL) 0.75 % vaginal gel nightly x 5 nights.   Vaginal discharge - Plan: [place], CLUE. Wet prep positive for clue cells. Recommend probiotic, avoiding scented soaps/detergents, use condoms, boric acid suppositories twice weekly. If recurrences continue we will consider suppressive treatment.   Screening examination for STD (sexually transmitted disease) - Plan: C. trachomatis/N. gonorrhoeae RNA  Urinary urgency - Plan: Urinalysis w microscopic + reflex culture. Negative UA.   Screening for cervical cancer - Normal pap history. Will repeat at 5-year interval  per guidelines.  Follow up in 1 year for annual.       Tamela Gammon [facility], 12:47 PM 01/30/2022

## 2022-01-31 LAB — C. TRACHOMATIS/N. GONORRHOEAE RNA
C. trachomatis RNA, TMA: NOT DETECTED
N. gonorrhoeae RNA, TMA: NOT DETECTED

## 2022-02-01 ENCOUNTER — Telehealth: Payer: Self-pay

## 2022-02-01 NOTE — Telephone Encounter (Signed)
-----   Message from Leamon Arnt, [place] sent at 02/01/2022 10:09 AM EDT ----- Patient has questions regarding prescription

## 2022-02-01 NOTE — Telephone Encounter (Signed)
LVMTCB

## 2022-02-06 NOTE — Telephone Encounter (Signed)
No returned call. Will close encounter.

## 2022-05-09 ENCOUNTER — Ambulatory Visit (INDEPENDENT_AMBULATORY_CARE_PROVIDER_SITE_OTHER): Payer: Medicaid Other | Admitting: *Deleted

## 2022-05-09 VITALS — BP 110/80

## 2022-05-09 DIAGNOSIS — Z3042 Encounter for surveillance of injectable contraceptive: Secondary | ICD-10-CM

## 2022-05-09 LAB — PREGNANCY, URINE: Preg Test, Ur: NEGATIVE

## 2022-05-09 MED ORDER — MEDROXYPROGESTERONE ACETATE 150 MG/ML IM SUSP
150.0000 mg | Freq: Once | INTRAMUSCULAR | Status: AC
  Administered 2022-05-09: 150 mg via INTRAMUSCULAR

## 2022-05-09 NOTE — Progress Notes (Signed)
Okayed to give depo provera per Marny Lowenstein, NP. UPT negative today & last sexually active 65mh ago.

## 2022-05-23 ENCOUNTER — Ambulatory Visit: Payer: Medicaid Other | Admitting: Nurse Practitioner

## 2022-05-23 ENCOUNTER — Telehealth: Payer: Self-pay | Admitting: Family Medicine

## 2022-05-23 NOTE — Telephone Encounter (Signed)
Pt was a no show for a NP app with Lauren on 05/23/22, she has medicaid, I did not send a letter.

## 2022-06-05 NOTE — Telephone Encounter (Signed)
Dismissed from [place] due to missed new pt visit

## 2022-09-03 ENCOUNTER — Telehealth: Payer: Self-pay

## 2022-09-03 NOTE — Telephone Encounter (Signed)
No, just UPT but if sexually active she should either abstain or use condoms until 1 week after Depo received.

## 2022-09-03 NOTE — Telephone Encounter (Signed)
Left message in voice mail for patient to call. 

## 2022-09-03 NOTE — Telephone Encounter (Signed)
Patient called stating she is past due for her D-P injection but would like to come and receive it. Her last injection was 05/10/23.  She said she knows she will have to do UPT but is there anything else she should do or will have to do?

## 2022-09-04 NOTE — Telephone Encounter (Signed)
Spoke with patient and informed her. She is scheduled tomorrow 09/05/22 at 9:15am

## 2022-09-05 ENCOUNTER — Ambulatory Visit (INDEPENDENT_AMBULATORY_CARE_PROVIDER_SITE_OTHER): Payer: Medicaid Other

## 2022-09-05 DIAGNOSIS — Z3042 Encounter for surveillance of injectable contraceptive: Secondary | ICD-10-CM | POA: Diagnosis not present

## 2022-09-05 LAB — PREGNANCY, URINE: Preg Test, Ur: NEGATIVE

## 2022-09-05 MED ORDER — MEDROXYPROGESTERONE ACETATE 150 MG/ML IM SUSY
150.0000 mg | PREFILLED_SYRINGE | Freq: Once | INTRAMUSCULAR | Status: AC
  Administered 2022-09-05: 150 mg via INTRAMUSCULAR

## 2023-02-12 ENCOUNTER — Telehealth: Payer: Self-pay | Admitting: *Deleted

## 2023-02-12 NOTE — Telephone Encounter (Signed)
Spoke with patient.  No menses while on depo provera, feels like menses is starting, no period to date.  Not currently sexually active.  Advised patient will need to abstain from intercourse for 2 weeks, come in for scheduled nurse visit, if UPT neg, proceed with depo. Keep AEX as scheduled.   Advised will review with Tiffany and return call to schedule if agreeable.   Tiffany -please review.

## 2023-02-12 NOTE — Telephone Encounter (Signed)
Patient left message requesting to restart depo provera.   Last AEX -01/30/22 -TW Received last Depo 09/05/22, due 6/20 -7/4 Next AEX scheduled  -TW  Left message to call [facility] Triage at [phone], option 4.

## 2023-02-12 NOTE — Telephone Encounter (Signed)
Spoke with patient. Advised per TW. Nurse visit scheduled for 9/25 at 1045.  Patient verbalizes understanding and is agreeable.   Encounter closed.

## 2023-02-12 NOTE — Telephone Encounter (Signed)
Yes, agree with plan of care. Thank you.

## 2023-02-26 ENCOUNTER — Ambulatory Visit: Payer: Medicaid Other

## 2023-05-20 ENCOUNTER — Ambulatory Visit: Payer: Medicaid Other | Admitting: Nurse Practitioner

## 2023-05-20 NOTE — Progress Notes (Deleted)
   Victoria Mcmahon 1991/06/02 [id number]   History:  32 y.o. [id number] presents for annual exam. Was on Depo but has not been consistent. Last injection April 2024. Normal pap history. Reports recurrent BV. Not using anything OTC for management. Used Boric acid suppositories in the past.  Gynecologic History No LMP recorded. Patient has had an injection.   Contraception/Family planning: none Sexually active: ***  Health Maintenance Last Pap: 07/07/2019. Results were: Normal neg HPV Last mammogram: Not indicated Last colonoscopy: Not indicated Last Dexa: Not indicated  Past medical history, past surgical history, family history and social history were all reviewed and documented in the EPIC chart. Works for [employer]. 50 yo daughter, 52 yo son.   ROS:  A ROS was performed and pertinent positives and negatives are included.  Exam:  There were no vitals filed for this visit.   There is no height or weight on file to calculate BMI.  General appearance:  Normal Thyroid:  Symmetrical, normal in size, without palpable masses or nodularity. Respiratory  Auscultation:  Clear without wheezing or rhonchi Cardiovascular  Auscultation:  Regular rate, without rubs, murmurs or gallops  Edema/varicosities:  Not grossly evident Abdominal  Soft,nontender, without masses, guarding or rebound.  Liver/spleen:  No organomegaly noted  Hernia:  None appreciated  Skin  Inspection:  Grossly normal Breasts: Examined lying and sitting.   Right: Without masses, retractions, nipple discharge or axillary adenopathy.   Left: Without masses, retractions, nipple discharge or axillary adenopathy. Pelvic: External genitalia:  no lesions              Urethra:  normal appearing urethra with no masses, tenderness or lesions              Bartholins and Skenes: normal                 Vagina: normal appearing vagina with normal color and discharge, no lesions              Cervix: no lesions Bimanual Exam:  Uterus:  no  masses or tenderness              Adnexa: no mass, fullness, tenderness              Rectovaginal: Deferred              Anus:  normal, no lesions  Patient informed chaperone available to be present for breast and pelvic exam. Patient has requested no chaperone to be present. Patient has been advised what will be completed during breast and pelvic exam.   Assessment/Plan:  32 y.o. [id number] for annual exam.   Well female exam with routine gynecological exam - Education provided on SBEs, importance of preventative screenings, current guidelines, high calcium diet, regular exercise, and multivitamin daily.  Screening for cervical cancer - Normal pap history. Will repeat at 5-year interval per guidelines.  No follow-ups on file.      Victoria Mcmahon [facility], 7:51 AM 05/20/2023

## 2023-07-03 DIAGNOSIS — J019 Acute sinusitis, unspecified: Secondary | ICD-10-CM | POA: Diagnosis not present

## 2023-07-03 DIAGNOSIS — E6609 Other obesity due to excess calories: Secondary | ICD-10-CM | POA: Diagnosis not present

## 2023-07-03 DIAGNOSIS — N3941 Urge incontinence: Secondary | ICD-10-CM | POA: Diagnosis not present

## 2023-07-03 DIAGNOSIS — Z6833 Body mass index (BMI) 33.0-33.9, adult: Secondary | ICD-10-CM | POA: Diagnosis not present

## 2023-08-20 DIAGNOSIS — M9902 Segmental and somatic dysfunction of thoracic region: Secondary | ICD-10-CM | POA: Diagnosis not present

## 2023-08-20 DIAGNOSIS — M542 Cervicalgia: Secondary | ICD-10-CM | POA: Diagnosis not present

## 2023-08-20 DIAGNOSIS — M546 Pain in thoracic spine: Secondary | ICD-10-CM | POA: Diagnosis not present

## 2023-08-20 DIAGNOSIS — M9901 Segmental and somatic dysfunction of cervical region: Secondary | ICD-10-CM | POA: Diagnosis not present

## 2023-08-21 ENCOUNTER — Other Ambulatory Visit (HOSPITAL_COMMUNITY)
Admission: RE | Admit: 2023-08-21 | Discharge: 2023-08-21 | Disposition: A | Discharge status: Home / Self Care | Source: Ambulatory Visit | Attending: Obstetrics and Gynecology | Admitting: Obstetrics and Gynecology

## 2023-08-21 DIAGNOSIS — N912 Amenorrhea, unspecified: Secondary | ICD-10-CM | POA: Diagnosis not present

## 2023-08-21 DIAGNOSIS — Z01419 Encounter for gynecological examination (general) (routine) without abnormal findings: Secondary | ICD-10-CM | POA: Insufficient documentation

## 2023-08-21 DIAGNOSIS — N898 Other specified noninflammatory disorders of vagina: Secondary | ICD-10-CM | POA: Diagnosis not present

## 2023-08-25 DIAGNOSIS — M9902 Segmental and somatic dysfunction of thoracic region: Secondary | ICD-10-CM | POA: Diagnosis not present

## 2023-08-25 DIAGNOSIS — M546 Pain in thoracic spine: Secondary | ICD-10-CM | POA: Diagnosis not present

## 2023-08-25 DIAGNOSIS — M9901 Segmental and somatic dysfunction of cervical region: Secondary | ICD-10-CM | POA: Diagnosis not present

## 2023-08-25 DIAGNOSIS — M542 Cervicalgia: Secondary | ICD-10-CM | POA: Diagnosis not present

## 2023-08-25 LAB — CYTOLOGY - PAP
Comment: NEGATIVE
Diagnosis: UNDETERMINED — AB
High risk HPV: NEGATIVE

## 2023-09-04 DIAGNOSIS — N3941 Urge incontinence: Secondary | ICD-10-CM | POA: Diagnosis not present

## 2023-09-04 DIAGNOSIS — Z6832 Body mass index (BMI) 32.0-32.9, adult: Secondary | ICD-10-CM | POA: Diagnosis not present

## 2023-09-04 DIAGNOSIS — E6609 Other obesity due to excess calories: Secondary | ICD-10-CM | POA: Diagnosis not present

## 2023-09-04 DIAGNOSIS — I950 Idiopathic hypotension: Secondary | ICD-10-CM | POA: Diagnosis not present

## 2023-09-08 DIAGNOSIS — M546 Pain in thoracic spine: Secondary | ICD-10-CM | POA: Diagnosis not present

## 2023-09-08 DIAGNOSIS — M9902 Segmental and somatic dysfunction of thoracic region: Secondary | ICD-10-CM | POA: Diagnosis not present

## 2023-09-08 DIAGNOSIS — M9901 Segmental and somatic dysfunction of cervical region: Secondary | ICD-10-CM | POA: Diagnosis not present

## 2023-09-08 DIAGNOSIS — M542 Cervicalgia: Secondary | ICD-10-CM | POA: Diagnosis not present

## 2023-10-15 DIAGNOSIS — F411 Generalized anxiety disorder: Secondary | ICD-10-CM | POA: Diagnosis not present

## 2023-10-30 DIAGNOSIS — F411 Generalized anxiety disorder: Secondary | ICD-10-CM | POA: Diagnosis not present

## 2023-11-18 DIAGNOSIS — F411 Generalized anxiety disorder: Secondary | ICD-10-CM | POA: Diagnosis not present

## 2023-12-02 DIAGNOSIS — F411 Generalized anxiety disorder: Secondary | ICD-10-CM | POA: Diagnosis not present

## 2023-12-29 DIAGNOSIS — F411 Generalized anxiety disorder: Secondary | ICD-10-CM | POA: Diagnosis not present

## 2024-01-12 DIAGNOSIS — F411 Generalized anxiety disorder: Secondary | ICD-10-CM | POA: Diagnosis not present

## 2024-02-05 DIAGNOSIS — F411 Generalized anxiety disorder: Secondary | ICD-10-CM | POA: Diagnosis not present

## 2024-02-13 DIAGNOSIS — L680 Hirsutism: Secondary | ICD-10-CM | POA: Diagnosis not present

## 2024-02-13 DIAGNOSIS — Z6830 Body mass index (BMI) 30.0-30.9, adult: Secondary | ICD-10-CM | POA: Diagnosis not present

## 2024-02-13 DIAGNOSIS — R140 Abdominal distension (gaseous): Secondary | ICD-10-CM | POA: Diagnosis not present

## 2024-02-13 DIAGNOSIS — E6609 Other obesity due to excess calories: Secondary | ICD-10-CM | POA: Diagnosis not present

## 2024-02-27 DIAGNOSIS — F411 Generalized anxiety disorder: Secondary | ICD-10-CM | POA: Diagnosis not present
